# Patient Record
Sex: Female | Born: 1985 | Race: Black or African American | Hispanic: No | Marital: Single | Smoking: Never smoker
Health system: Southern US, Community
[De-identification: ages and names within clinical notes are randomized; demographics above are authoritative.]

## PROBLEM LIST (undated history)

## (undated) DIAGNOSIS — J45909 Unspecified asthma, uncomplicated: Secondary | ICD-10-CM

## (undated) DIAGNOSIS — D649 Anemia, unspecified: Secondary | ICD-10-CM

## (undated) HISTORY — DX: Anemia, unspecified: D64.9

## (undated) HISTORY — DX: Unspecified asthma, uncomplicated: J45.909

---
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
## 2003-07-02 ENCOUNTER — Other Ambulatory Visit: Admission: RE | Admit: 2003-07-02 | Discharge: 2003-07-02 | Payer: Self-pay | Admitting: Obstetrics and Gynecology

## 2004-08-02 ENCOUNTER — Emergency Department (HOSPITAL_COMMUNITY): Admission: EM | Admit: 2004-08-02 | Discharge: 2004-08-02 | Payer: Self-pay | Admitting: Emergency Medicine

## 2004-11-14 ENCOUNTER — Emergency Department (HOSPITAL_COMMUNITY): Admission: EM | Admit: 2004-11-14 | Discharge: 2004-11-14 | Payer: Self-pay | Admitting: Emergency Medicine

## 2004-11-28 ENCOUNTER — Other Ambulatory Visit: Admission: RE | Admit: 2004-11-28 | Discharge: 2004-11-28 | Payer: Self-pay | Admitting: Family Medicine

## 2005-06-01 ENCOUNTER — Encounter: Admission: RE | Admit: 2005-06-01 | Discharge: 2005-06-01 | Payer: Self-pay | Admitting: Family Medicine

## 2005-06-22 ENCOUNTER — Ambulatory Visit: Payer: Self-pay | Admitting: Internal Medicine

## 2005-07-16 ENCOUNTER — Ambulatory Visit: Payer: Self-pay | Admitting: Gastroenterology

## 2005-10-26 ENCOUNTER — Other Ambulatory Visit: Admission: RE | Admit: 2005-10-26 | Discharge: 2005-10-26 | Payer: Self-pay | Admitting: Obstetrics and Gynecology

## 2005-12-21 ENCOUNTER — Other Ambulatory Visit: Admission: RE | Admit: 2005-12-21 | Discharge: 2005-12-21 | Payer: Self-pay | Admitting: Obstetrics and Gynecology

## 2007-02-08 ENCOUNTER — Inpatient Hospital Stay (HOSPITAL_COMMUNITY): Admission: AD | Admit: 2007-02-08 | Discharge: 2007-02-09 | Payer: Self-pay | Admitting: Gynecology

## 2007-02-10 ENCOUNTER — Inpatient Hospital Stay (HOSPITAL_COMMUNITY): Admission: AD | Admit: 2007-02-10 | Discharge: 2007-02-10 | Payer: Self-pay | Admitting: Obstetrics & Gynecology

## 2007-02-13 ENCOUNTER — Inpatient Hospital Stay (HOSPITAL_COMMUNITY): Admission: AD | Admit: 2007-02-13 | Discharge: 2007-02-13 | Payer: Self-pay | Admitting: Obstetrics and Gynecology

## 2007-02-19 ENCOUNTER — Inpatient Hospital Stay (HOSPITAL_COMMUNITY): Admission: AD | Admit: 2007-02-19 | Discharge: 2007-02-19 | Payer: Self-pay | Admitting: Obstetrics and Gynecology

## 2007-02-26 ENCOUNTER — Inpatient Hospital Stay (HOSPITAL_COMMUNITY): Admission: AD | Admit: 2007-02-26 | Discharge: 2007-02-26 | Payer: Self-pay | Admitting: Gynecology

## 2007-03-05 ENCOUNTER — Inpatient Hospital Stay (HOSPITAL_COMMUNITY): Admission: AD | Admit: 2007-03-05 | Discharge: 2007-03-05 | Payer: Self-pay | Admitting: Obstetrics and Gynecology

## 2008-01-08 ENCOUNTER — Emergency Department (HOSPITAL_COMMUNITY): Admission: EM | Admit: 2008-01-08 | Discharge: 2008-01-08 | Payer: Self-pay | Admitting: Family Medicine

## 2008-06-30 ENCOUNTER — Emergency Department (HOSPITAL_COMMUNITY): Admission: EM | Admit: 2008-06-30 | Discharge: 2008-06-30 | Payer: Self-pay | Admitting: Emergency Medicine

## 2010-05-07 ENCOUNTER — Emergency Department (HOSPITAL_COMMUNITY): Admission: EM | Admit: 2010-05-07 | Discharge: 2010-05-07 | Payer: Self-pay | Admitting: Family Medicine

## 2010-08-16 ENCOUNTER — Inpatient Hospital Stay (HOSPITAL_COMMUNITY): Admission: AD | Admit: 2010-08-16 | Discharge: 2010-08-18 | Payer: Self-pay | Admitting: Obstetrics and Gynecology

## 2010-10-09 ENCOUNTER — Encounter: Admission: RE | Admit: 2010-10-09 | Discharge: 2010-10-09 | Payer: Self-pay | Admitting: Family Medicine

## 2011-01-07 ENCOUNTER — Inpatient Hospital Stay (INDEPENDENT_AMBULATORY_CARE_PROVIDER_SITE_OTHER)
Admission: RE | Admit: 2011-01-07 | Discharge: 2011-01-07 | Disposition: A | Discharge status: Home / Self Care | Payer: Medicaid Other | Source: Ambulatory Visit | Attending: Emergency Medicine | Admitting: Emergency Medicine

## 2011-01-07 DIAGNOSIS — J069 Acute upper respiratory infection, unspecified: Secondary | ICD-10-CM

## 2011-01-07 DIAGNOSIS — H9209 Otalgia, unspecified ear: Secondary | ICD-10-CM

## 2011-02-01 LAB — URINE MICROSCOPIC-ADD ON

## 2011-02-01 LAB — CBC
HCT: 21.3 % — ABNORMAL LOW (ref 36.0–46.0)
HCT: 30.8 % — ABNORMAL LOW (ref 36.0–46.0)
Hemoglobin: 10.2 g/dL — ABNORMAL LOW (ref 12.0–15.0)
Hemoglobin: 7.3 g/dL — ABNORMAL LOW (ref 12.0–15.0)
MCV: 91.5 fL (ref 78.0–100.0)
RBC: 2.33 MIL/uL — ABNORMAL LOW (ref 3.87–5.11)
RBC: 3.42 MIL/uL — ABNORMAL LOW (ref 3.87–5.11)
RDW: 15.1 % (ref 11.5–15.5)
WBC: 14.7 10*3/uL — ABNORMAL HIGH (ref 4.0–10.5)
WBC: 8.9 10*3/uL (ref 4.0–10.5)

## 2011-02-01 LAB — URINALYSIS, ROUTINE W REFLEX MICROSCOPIC
Bilirubin Urine: NEGATIVE
Nitrite: NEGATIVE
Specific Gravity, Urine: 1.025 (ref 1.005–1.030)
Urobilinogen, UA: 0.2 mg/dL (ref 0.0–1.0)
pH: 6 (ref 5.0–8.0)

## 2011-02-01 LAB — RAPID URINE DRUG SCREEN, HOSP PERFORMED
Amphetamines: NOT DETECTED
Tetrahydrocannabinol: NOT DETECTED

## 2011-02-01 LAB — RPR: RPR Ser Ql: NONREACTIVE

## 2011-08-17 LAB — HERPES SIMPLEX VIRUS CULTURE: Culture: DETECTED

## 2014-06-15 ENCOUNTER — Encounter (INDEPENDENT_AMBULATORY_CARE_PROVIDER_SITE_OTHER): Payer: BC Managed Care – PPO | Admitting: Surgery

## 2016-09-21 ENCOUNTER — Institutional Professional Consult (permissible substitution): Payer: Medicaid Other | Admitting: Internal Medicine

## 2017-08-13 DIAGNOSIS — L309 Dermatitis, unspecified: Secondary | ICD-10-CM | POA: Insufficient documentation

## 2018-10-24 ENCOUNTER — Ambulatory Visit: Payer: Self-pay | Admitting: Nurse Practitioner

## 2019-01-02 ENCOUNTER — Encounter: Payer: Self-pay | Admitting: Nurse Practitioner

## 2019-01-02 ENCOUNTER — Ambulatory Visit: Payer: BC Managed Care – PPO | Admitting: Nurse Practitioner

## 2019-01-02 VITALS — BP 120/78 | HR 82 | Temp 98.3°F | Ht 68.8 in | Wt 220.4 lb

## 2019-01-02 DIAGNOSIS — Z8269 Family history of other diseases of the musculoskeletal system and connective tissue: Secondary | ICD-10-CM | POA: Diagnosis not present

## 2019-01-02 DIAGNOSIS — R768 Other specified abnormal immunological findings in serum: Secondary | ICD-10-CM | POA: Diagnosis not present

## 2019-01-02 DIAGNOSIS — R21 Rash and other nonspecific skin eruption: Secondary | ICD-10-CM | POA: Diagnosis not present

## 2019-01-02 MED ORDER — TRIAMCINOLONE ACETONIDE 0.025 % EX OINT: 1.0000 "application " | TOPICAL_OINTMENT | Freq: Two times a day (BID) | CUTANEOUS | 0 refills | Status: DC

## 2019-01-02 NOTE — Progress Notes (Signed)
  Subjective:     Patient ID: Melanie Brooks , female    DOB: 1986/11/10 , 33 y.o.   MRN: [id number]   Chief Complaint  Patient presents with  . Rash    patient broke ou in a rash around her mouth and the top of her lip feels scaly and the skin feels hard.    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 Virgil Benedict for previous  Rash  This is a new problem. The current episode started in the past 7 days. The affected locations include the lips. The rash is characterized by scaling. Associated with: she has been using ambi - x 1 month ago.   she has also been using a new lip gloss.  Pertinent negatives include no anorexia, fatigue, fever or sore throat. Past treatments include nothing. The treatment provided no relief. Her past medical history is significant for eczema. There is no history of asthma.     No past medical history on file.   No family history on file.  No current outpatient medications on file.   Allergies  Allergen Reactions  . Excedrin Pm [Diphenhydramine-Apap (Sleep)] Hives     Review of Systems  Constitutional: Negative for fatigue and fever.  HENT: Negative for sore throat.   Gastrointestinal: Negative for anorexia.  Skin: Positive for rash.     Today's Vitals   01/02/19 1601  BP: 120/78  Pulse: 82  Temp: 98.3 F (36.8 C)  TempSrc: Oral  SpO2: 97%  Weight: 220 lb 6.4 oz (100 kg)  Height: 5' 8.8" (1.748 m)  PainSc: 0-No pain   Body mass index is 32.74 kg/m.   Objective:  Physical Exam      Assessment And Plan:     1. Rash and nonspecific skin eruption  Dry scaly slightly hyperpigmented rash around upper lip  HSV vs dermatitis   She also has a hyperpigmentation around her nose - ANA+ENA+DNA/DS+Scl 70+SjoSSA/B - HSV Type I/II IgG, IgMw/ reflex  2. ANA positive  This has been elevated in the past over 3 years ago  Will check again today since she did not follow up previously  Her mother has a history of  lupus - ANA+ENA+DNA/DS+Scl 70+SjoSSA/B - HSV Type I/II IgG, IgMw/ reflex   [name], FNP

## 2019-01-06 LAB — HSV TYPE I/II IGG, IGMW/ REFLEX
HSV 1 Glycoprotein G Ab, IgG: 19.8 index — ABNORMAL HIGH (ref 0.00–0.90)
HSV 1 IgM: <1:10 {titer}
HSV 2 IgG, Type Spec: <0.91 index (ref 0.00–0.90)
HSV 2 IgM: <1:10 {titer}

## 2019-01-06 LAB — ANA+ENA+DNA/DS+SCL 70+SJOSSA/B
ANA TITER 1: POSITIVE — AB
ENA RNP Ab: 0.8 AI (ref 0.0–0.9)
ENA SSB (LA) Ab: 0.9 AI (ref 0.0–0.9)
dsDNA Ab: <1 IU/mL (ref 0–9)

## 2019-01-06 LAB — FANA STAINING PATTERNS: Speckled Pattern: 1:80 {titer}

## 2019-01-09 ENCOUNTER — Other Ambulatory Visit: Payer: Self-pay | Admitting: Nurse Practitioner

## 2019-01-09 DIAGNOSIS — B009 Herpesviral infection, unspecified: Secondary | ICD-10-CM

## 2019-01-09 DIAGNOSIS — R768 Other specified abnormal immunological findings in serum: Secondary | ICD-10-CM

## 2019-01-09 DIAGNOSIS — Z8269 Family history of other diseases of the musculoskeletal system and connective tissue: Secondary | ICD-10-CM

## 2019-01-09 MED ORDER — VALACYCLOVIR HCL 500 MG PO TABS: 500.0000 mg | ORAL_TABLET | Freq: Two times a day (BID) | ORAL | 0 refills | Status: DC

## 2019-01-09 NOTE — Progress Notes (Signed)
Okay, I will send a medication for her to take twice a day for 14 days, she did not mention a rash to her genital area when she was here in the office.  I am also referring her to a rheumatologist for positive ANA and elevated autoimmune panel.

## 2019-01-13 ENCOUNTER — Encounter: Payer: Self-pay | Admitting: Nurse Practitioner

## 2019-06-10 ENCOUNTER — Telehealth: Payer: Self-pay

## 2019-06-10 NOTE — Telephone Encounter (Signed)
Patient called stating her period started on 07/06 and lasted her normal 4-5 days but she is still bleeding I returned pt call and advised her to f/u with her OBGYN. Melanie Brooks

## 2019-06-15 ENCOUNTER — Other Ambulatory Visit (HOSPITAL_COMMUNITY)
Admission: RE | Admit: 2019-06-15 | Discharge: 2019-06-15 | Disposition: A | Discharge status: Home / Self Care | Payer: BC Managed Care – PPO | Source: Ambulatory Visit | Attending: Nurse Practitioner | Admitting: Nurse Practitioner

## 2019-06-15 ENCOUNTER — Other Ambulatory Visit: Payer: Self-pay

## 2019-06-15 ENCOUNTER — Ambulatory Visit: Payer: BC Managed Care – PPO | Admitting: Nurse Practitioner

## 2019-06-15 ENCOUNTER — Encounter: Payer: Self-pay | Admitting: Nurse Practitioner

## 2019-06-15 VITALS — BP 110/72 | HR 92 | Temp 98.6°F | Ht 68.8 in | Wt 224.2 lb

## 2019-06-15 DIAGNOSIS — Z708 Other sex counseling: Secondary | ICD-10-CM

## 2019-06-15 DIAGNOSIS — Z0000 Encounter for general adult medical examination without abnormal findings: Secondary | ICD-10-CM | POA: Diagnosis not present

## 2019-06-15 DIAGNOSIS — Z124 Encounter for screening for malignant neoplasm of cervix: Secondary | ICD-10-CM

## 2019-06-15 LAB — POCT URINALYSIS DIPSTICK
Bilirubin, UA: NEGATIVE
Glucose, UA: NEGATIVE
Ketones, UA: NEGATIVE
Leukocytes, UA: NEGATIVE
Nitrite, UA: NEGATIVE
Protein, UA: NEGATIVE
Spec Grav, UA: 1.025 (ref 1.010–1.025)
Urobilinogen, UA: 0.2 E.U./dL
pH, UA: 6 (ref 5.0–8.0)

## 2019-06-15 NOTE — Progress Notes (Signed)
Subjective:     Patient ID: Melanie Brooks , female    DOB: [birth year] , 33 y.o.   MRN: [id number]   Chief Complaint  Patient presents with  . Annual Exam    HPI  Here for HM  LMP - July 1 on for the entire month.  Today is the first day she has had spotting. Her Nexplanon is due to come out as of March 2019.    The patient states she uses Nexplanon for birth control. Last LMP was No LMP recorded. Patient has had an implant.. Negative for Dysmenorrhea and Positive for Menorrhagia.  Negative for: breast discharge, breast lump(s), breast pain and breast self exam.  Pertinent negatives include abnormal bleeding (hematology), anxiety, decreased libido, depression, difficulty falling sleep, dyspareunia, history of infertility, nocturia, sexual dysfunction, sleep disturbances, urinary incontinence, urinary urgency, vaginal discharge and vaginal itching. Diet regular.The patient states her exercise level is      The patient's tobacco use is:  Social History   Tobacco Use  Smoking Status Never Smoker  Smokeless Tobacco Never Used   She has been exposed to passive smoke. The patient's alcohol use is:  Social History   Substance and Sexual Activity  Alcohol Use Yes   Comment: socially     No past medical history on file.   No family history on file.   Current Outpatient Medications:  .  fexofenadine (ALLEGRA) 60 MG tablet, Take 60 mg by mouth 2 (two) times daily., Disp: , Rfl:  .  triamcinolone (KENALOG) 0.025 % ointment, Apply 1 application topically 2 (two) times daily. (Patient not taking: Reported on 06/15/2019), Disp: 30 g, Rfl: 0 .  valACYclovir (VALTREX) 500 MG tablet, Take 1 tablet (500 mg total) by mouth 2 (two) times daily. (Patient not taking: Reported on 06/15/2019), Disp: 14 tablet, Rfl: 0   Allergies  Allergen Reactions  . Excedrin Pm [Diphenhydramine-Apap (Sleep)] Hives     Review of Systems  Constitutional: Negative.   HENT: Negative.   Eyes: Negative.    Respiratory: Negative.   Cardiovascular: Negative.  Negative for chest pain, palpitations and leg swelling.  Gastrointestinal: Negative.   Endocrine: Negative.   Genitourinary: Negative.   Musculoskeletal: Negative.   Skin: Negative.   Allergic/Immunologic: Negative.   Neurological: Negative.  Negative for dizziness, facial asymmetry and headaches.  Hematological: Negative.   Psychiatric/Behavioral: Negative.      Today's Vitals   06/15/19 1529  BP: 110/72  Pulse: 92  Temp: 98.6 F (37 C)  TempSrc: Oral  Weight: 224 lb 3.2 oz (101.7 kg)  Height: 5' 8.8" (1.748 m)  PainSc: 0-No pain   Body mass index is 33.3 kg/m.   Objective:  Physical Exam Constitutional:      Appearance: Normal appearance. She is well-developed.  HENT:     Head: Normocephalic and atraumatic.     Right Ear: Hearing, tympanic membrane, ear canal and external ear normal. There is no impacted cerumen.     Left Ear: Hearing, tympanic membrane, ear canal and external ear normal. There is no impacted cerumen.  Eyes:     General: Lids are normal.        Right eye: No discharge.     Conjunctiva/sclera: Conjunctivae normal.     Pupils: Pupils are equal, round, and reactive to light.     Funduscopic exam:    Right eye: No papilledema.        Left eye: No papilledema.  Neck:     Musculoskeletal: Full passive  range of motion without pain, normal range of motion and neck supple.     Thyroid: No thyroid mass.     Vascular: No carotid bruit.  Cardiovascular:     Rate and Rhythm: Normal rate and regular rhythm.     Pulses: Normal pulses.     Heart sounds: Normal heart sounds. No murmur.  Pulmonary:     Effort: Pulmonary effort is normal. No respiratory distress.     Breath sounds: Normal breath sounds.  Abdominal:     General: Abdomen is flat. Bowel sounds are normal. There is no distension.     Palpations: Abdomen is soft.  Musculoskeletal: Normal range of motion.        General: No swelling.     Right  lower leg: No edema.     Left lower leg: No edema.  Skin:    General: Skin is warm and dry.     Capillary Refill: Capillary refill takes less than 2 seconds.  Neurological:     General: No focal deficit present.     Mental Status: She is alert and oriented to person, place, and time.     Cranial Nerves: No cranial nerve deficit.     Sensory: No sensory deficit.  Psychiatric:        Mood and Affect: Mood normal.        Behavior: Behavior normal.        Thought Content: Thought content normal.        Judgment: Judgment normal.         Assessment And Plan:     1. Encounter for general adult medical examination w/o abnormal findings . Behavior modifications discussed and diet history reviewed.   . Pt will continue to exercise regularly and modify diet with low GI, plant based foods and decrease intake of processed foods.  . Recommend intake of daily multivitamin, Vitamin D, and calcium.  . Recommend for preventive screenings, as well as recommend immunizations that include influenza, TDAP - POCT Urinalysis Dipstick (81002) - BMP8+Anion Gap - Lipid Profile - CBC no Diff - Hemoglobin A1c - Vitamin D (25 hydroxy)  2. Encounter for Papanicolaou smear of cervix  No abnormal findings on physical exam - Cytology -Pap Smear  3. Encounter for sexually transmitted disease counseling  - Cervicovaginal ancillary only - T pallidum Screening Cascade - HIV antibody (with reflex)     Minette Brine, FNP    THE PATIENT IS ENCOURAGED TO PRACTICE SOCIAL DISTANCING DUE TO THE COVID-19 PANDEMIC.

## 2019-06-17 LAB — BMP8+ANION GAP
Anion Gap: 16 mmol/L (ref 10.0–18.0)
BUN/Creatinine Ratio: 9 (ref 9–23)
BUN: 9 mg/dL (ref 6–20)
CO2: 23 mmol/L (ref 20–29)
Calcium: 9.4 mg/dL (ref 8.7–10.2)
Chloride: 103 mmol/L (ref 96–106)
Creatinine, Ser: 0.98 mg/dL (ref 0.57–1.00)
GFR calc Af Amer: 86 mL/min/{1.73_m2} (ref >59)
GFR calc non Af Amer: 75 mL/min/{1.73_m2} (ref >59)
Glucose: 77 mg/dL (ref 65–99)
Potassium: 3.4 mmol/L — ABNORMAL LOW (ref 3.5–5.2)
Sodium: 142 mmol/L (ref 134–144)

## 2019-06-17 LAB — LIPID PANEL
Chol/HDL Ratio: 3.2 ratio (ref 0.0–4.4)
Cholesterol, Total: 138 mg/dL (ref 100–199)
HDL: 43 mg/dL (ref >39)
LDL Calculated: 76 mg/dL (ref 0–99)
Triglycerides: 96 mg/dL (ref 0–149)
VLDL Cholesterol Cal: 19 mg/dL (ref 5–40)

## 2019-06-17 LAB — CBC
Hematocrit: 36.6 % (ref 34.0–46.6)
Hemoglobin: 11.7 g/dL (ref 11.1–15.9)
MCH: 29.7 pg (ref 26.6–33.0)
MCHC: 32 g/dL (ref 31.5–35.7)
MCV: 93 fL (ref 79–97)
Platelets: 286 10*3/uL (ref 150–450)
RBC: 3.94 x10E6/uL (ref 3.77–5.28)
RDW: 12.2 % (ref 11.7–15.4)
WBC: 6 10*3/uL (ref 3.4–10.8)

## 2019-06-17 LAB — T PALLIDUM SCREENING CASCADE: T pallidum Antibodies (TP-PA): NONREACTIVE

## 2019-06-17 LAB — HEMOGLOBIN A1C
Est. average glucose Bld gHb Est-mCnc: 126 mg/dL
Hgb A1c MFr Bld: 6 % — ABNORMAL HIGH (ref 4.8–5.6)

## 2019-06-17 LAB — HIV ANTIBODY (ROUTINE TESTING W REFLEX): HIV Screen 4th Generation wRfx: NONREACTIVE

## 2019-06-17 LAB — VITAMIN D 25 HYDROXY (VIT D DEFICIENCY, FRACTURES): Vit D, 25-Hydroxy: 27.5 ng/mL — ABNORMAL LOW (ref 30.0–100.0)

## 2019-06-18 LAB — CERVICOVAGINAL ANCILLARY ONLY
Bacterial vaginitis: POSITIVE — AB
Candida vaginitis: NEGATIVE
Chlamydia: NEGATIVE
Neisseria Gonorrhea: NEGATIVE
Trichomonas: NEGATIVE

## 2019-06-19 LAB — CYTOLOGY - PAP
Adequacy: ABSENT
Diagnosis: NEGATIVE

## 2019-06-26 ENCOUNTER — Other Ambulatory Visit: Payer: Self-pay | Admitting: Nurse Practitioner

## 2019-06-26 DIAGNOSIS — B9689 Other specified bacterial agents as the cause of diseases classified elsewhere: Secondary | ICD-10-CM

## 2019-06-26 DIAGNOSIS — N760 Acute vaginitis: Secondary | ICD-10-CM

## 2019-06-26 MED ORDER — METRONIDAZOLE 500 MG PO TABS: 500.0000 mg | ORAL_TABLET | Freq: Two times a day (BID) | ORAL | 0 refills | Status: DC

## 2019-07-06 ENCOUNTER — Other Ambulatory Visit: Payer: Self-pay | Admitting: Nurse Practitioner

## 2019-07-06 ENCOUNTER — Other Ambulatory Visit: Payer: Self-pay

## 2019-07-06 MED ORDER — METRONIDAZOLE 0.75 % EX GEL: 1.0000 "application " | Freq: Two times a day (BID) | CUTANEOUS | 0 refills | Status: DC

## 2019-07-06 MED ORDER — METRONIDAZOLE 1 % EX GEL: CUTANEOUS | 0 refills | Status: DC

## 2019-07-09 ENCOUNTER — Telehealth: Payer: Self-pay

## 2019-07-09 ENCOUNTER — Other Ambulatory Visit: Payer: Self-pay

## 2019-07-09 MED ORDER — FLUCONAZOLE 150 MG PO TABS: ORAL_TABLET | ORAL | 0 refills | Status: DC

## 2019-07-09 NOTE — Telephone Encounter (Signed)
Patient called asking if the metronidazole we had sent to the pharmacy was to treat the BV or yeast infection. She stated she is having some discharge.  RETURNED PT CALL AND ADVISED HER THAT IT WAS TO TREAT HER BV AND WE HAVE SENT IN THE DIFLUCAN FOR THE YEAST INFECTION. YRL,RMA

## 2019-08-07 ENCOUNTER — Other Ambulatory Visit: Payer: Self-pay | Admitting: Nurse Practitioner

## 2019-09-21 ENCOUNTER — Other Ambulatory Visit: Payer: Self-pay

## 2019-09-21 ENCOUNTER — Encounter: Payer: Self-pay | Admitting: Nurse Practitioner

## 2019-09-21 ENCOUNTER — Ambulatory Visit: Payer: BC Managed Care – PPO | Admitting: Nurse Practitioner

## 2019-09-21 VITALS — BP 116/68 | HR 77 | Temp 98.4°F | Ht 67.8 in | Wt 223.2 lb

## 2019-09-21 DIAGNOSIS — J302 Other seasonal allergic rhinitis: Secondary | ICD-10-CM

## 2019-09-21 DIAGNOSIS — R519 Headache, unspecified: Secondary | ICD-10-CM

## 2019-09-21 DIAGNOSIS — R21 Rash and other nonspecific skin eruption: Secondary | ICD-10-CM | POA: Diagnosis not present

## 2019-09-21 DIAGNOSIS — G8929 Other chronic pain: Secondary | ICD-10-CM

## 2019-09-21 MED ORDER — FEXOFENADINE HCL 60 MG PO TABS: 60.0000 mg | ORAL_TABLET | Freq: Every day | ORAL | 1 refills | Status: AC

## 2019-09-21 MED ORDER — UBRELVY 50 MG PO TABS: 1.0000 | ORAL_TABLET | Freq: Every day | ORAL | 2 refills | Status: DC

## 2019-09-21 NOTE — Progress Notes (Signed)
Subjective:     Patient ID: Melanie Brooks , female    DOB: [birth year] , 33 y.o.   MRN: [id number]   Chief Complaint  Patient presents with  . Migraine    Patient stated she has been trying to prevent her migraines with diet, limited screen time.     HPI  Flourescent lights will intensify her headaches.  When she eats pork she will have migraines.  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  She began having more migraines since mid September, for the last month she has been at the school 8 hours.  She is taking 800 mg twice a day.  LMP - October 18-22nd - she is on NuvaRing no longer has Implanon.  She is exercising by walking and drinks water during the day regularly.   Migraine  This is a chronic problem. The current episode started more than 1 month ago. The problem occurs intermittently. The problem has been gradually worsening. The pain is located in the bilateral (middle going towards the back) region. The pain does not radiate. The pain quality is similar to prior headaches. The quality of the pain is described as aching and pulsating. The pain is at a severity of 6/10. The pain is moderate. Associated symptoms include photophobia. Pertinent negatives include no abdominal pain, blurred vision, dizziness, nausea or tinnitus. The symptoms are aggravated by bright light and work (Works as a Education officer, museum ). She has tried NSAIDs for the symptoms. The treatment provided no relief. Her past medical history is significant for migraine headaches. There is no history of cancer.     No past medical history on file.   No family history on file.   Current Outpatient Medications:  .  fexofenadine (ALLEGRA) 60 MG tablet, Take 60 mg by mouth 2 (two) times daily., Disp: , Rfl:  .  ibuprofen (ADVIL) 800 MG tablet, Take 800 mg by mouth every 8 (eight) hours as needed., Disp: , Rfl:  .  triamcinolone (KENALOG) 0.025 % ointment, Apply 1 application topically 2 (two) times  daily. (Patient not taking: Reported on 06/15/2019), Disp: 30 g, Rfl: 0 .  valACYclovir (VALTREX) 500 MG tablet, Take 1 tablet (500 mg total) by mouth 2 (two) times daily. (Patient not taking: Reported on 06/15/2019), Disp: 14 tablet, Rfl: 0   Allergies  Allergen Reactions  . Excedrin Pm [Diphenhydramine-Apap (Sleep)] Hives     Review of Systems  Constitutional: Negative.   HENT: Negative for tinnitus.   Eyes: Positive for photophobia. Negative for blurred vision.  Respiratory: Negative.   Cardiovascular: Negative.  Negative for chest pain, palpitations and leg swelling.  Gastrointestinal: Negative for abdominal pain and nausea.  Neurological: Positive for headaches. Negative for dizziness.  Psychiatric/Behavioral: Negative.      Today's Vitals   09/21/19 1557  BP: 116/68  Pulse: 77  Temp: 98.4 F (36.9 C)  TempSrc: Oral  Weight: 223 lb 3.2 oz (101.2 kg)  Height: 5' 7.8" (1.722 m)  PainSc: 6   PainLoc: Head   Body mass index is 34.14 kg/m.   Objective:  Physical Exam Vitals signs reviewed.  Cardiovascular:     Rate and Rhythm: Normal rate and regular rhythm.     Pulses: Normal pulses.     Heart sounds: Normal heart sounds. No murmur.  Pulmonary:     Effort: Pulmonary effort is normal. No respiratory distress.     Breath sounds: Normal breath sounds.  Skin:    Capillary  Refill: Capillary refill takes less than 2 seconds.  Neurological:     General: No focal deficit present.     Mental Status: She is alert and oriented to person, place, and time.  Psychiatric:        Mood and Affect: Mood normal.        Behavior: Behavior normal.        Thought Content: Thought content normal.        Judgment: Judgment normal.         Assessment And Plan:      1. Chronic nonintractable headache, unspecified headache type  The reoccurrence of her headaches are likely related to stress and the flourescent lights are worsening the symptoms  Will try her with ubrelvy as  needed  2. Seasonal allergies  Her seasonal allergies could also be causing her headaches  Will refill prescription for fexofenadine - fexofenadine (ALLEGRA) 60 MG tablet; Take 1 tablet (60 mg total) by mouth daily.  Dispense: 90 tablet; Refill: 1  3. Rash and nonspecific skin eruption  Flesh colored slight prickly rash to left arm  Encouraged to use the triamcinolone cream may be a dermatitis   [name], FNP    THE PATIENT IS ENCOURAGED TO PRACTICE SOCIAL DISTANCING DUE TO THE COVID-19 PANDEMIC.

## 2019-09-27 ENCOUNTER — Encounter: Payer: Self-pay | Admitting: Nurse Practitioner

## 2019-09-29 ENCOUNTER — Other Ambulatory Visit: Payer: Self-pay

## 2019-09-29 ENCOUNTER — Telehealth: Payer: Self-pay

## 2019-09-29 NOTE — Telephone Encounter (Signed)
I called patient because her Melanie Brooks was denied and she wanted to see how she is doing and if she is ok with trying a different med. YRL,RMA

## 2019-12-01 ENCOUNTER — Other Ambulatory Visit: Payer: Self-pay

## 2019-12-01 MED ORDER — SUMATRIPTAN SUCCINATE 50 MG PO TABS: ORAL_TABLET | ORAL | 0 refills | Status: DC

## 2020-01-16 ENCOUNTER — Ambulatory Visit: Payer: BC Managed Care – PPO

## 2020-02-15 ENCOUNTER — Encounter: Payer: Self-pay | Admitting: Nurse Practitioner

## 2020-02-15 ENCOUNTER — Ambulatory Visit: Payer: BC Managed Care – PPO | Admitting: Nurse Practitioner

## 2020-02-15 ENCOUNTER — Other Ambulatory Visit: Payer: Self-pay

## 2020-02-15 VITALS — BP 118/78 | HR 102 | Temp 98.5°F | Ht 67.8 in | Wt 222.0 lb

## 2020-02-15 DIAGNOSIS — R519 Headache, unspecified: Secondary | ICD-10-CM | POA: Diagnosis not present

## 2020-02-15 DIAGNOSIS — G8929 Other chronic pain: Secondary | ICD-10-CM

## 2020-02-15 DIAGNOSIS — J302 Other seasonal allergic rhinitis: Secondary | ICD-10-CM | POA: Diagnosis not present

## 2020-02-15 MED ORDER — KETOROLAC TROMETHAMINE 60 MG/2ML IM SOLN
60.0000 mg | Freq: Once | INTRAMUSCULAR | Status: AC
  Administered 2020-02-15: 60 mg via INTRAMUSCULAR

## 2020-02-15 MED ORDER — LEVOCETIRIZINE DIHYDROCHLORIDE 5 MG PO TABS: 5.0000 mg | ORAL_TABLET | Freq: Every evening | ORAL | 1 refills | Status: DC

## 2020-02-15 MED ORDER — UBRELVY 50 MG PO TABS: 1.0000 | ORAL_TABLET | Freq: Every day | ORAL | 2 refills | Status: DC | PRN

## 2020-02-15 NOTE — Progress Notes (Signed)
This visit occurred during the SARS-CoV-2 public health emergency.  Safety protocols were in place, including screening questions prior to the visit, additional usage of staff PPE, and extensive cleaning of exam room while observing appropriate contact time as indicated for disinfecting solutions.  Subjective:     Patient ID: Melanie Brooks , female    DOB: 1986/05/17 , 34 y.o.   MRN: [id number]   Chief Complaint  Patient presents with  . Migraine    b/p check    HPI  She had her covid vaccine on Saturday - sore arm and migraine since then.  She also reports her blood pressure also went up during that time. February 7th LMP she has been keeping her nuvaring.  This week she has  Migraine  This is a recurrent problem. The current episode started more than 1 year ago. The problem occurs intermittently. The pain does not radiate. The pain quality is not similar to prior headaches. The pain is moderate. Pertinent negatives include no back pain, dizziness, sinus pressure or weakness. She has tried triptans for the symptoms. Her past medical history is significant for migraine headaches. There is no history of cancer or hypertension.     No past medical history on file.   No family history on file.   Current Outpatient Medications:  .  acetaminophen (TYLENOL) 500 MG tablet, Take 500 mg by mouth every 6 (six) hours as needed., Disp: , Rfl:  .  fexofenadine (ALLEGRA) 60 MG tablet, Take 1 tablet (60 mg total) by mouth daily., Disp: 90 tablet, Rfl: 1 .  SUMAtriptan (IMITREX) 50 MG tablet, Take at onset of headache May repeat in 2 hours if headache persists or recurs Do not exceed more than 2 tablets in 24 hours, Disp: 8 tablet, Rfl: 0   Allergies  Allergen Reactions  . Excedrin Pm [Diphenhydramine-Apap (Sleep)] Hives     Review of Systems  Constitutional: Negative.   HENT: Negative for sinus pressure.   Respiratory: Negative.   Cardiovascular: Negative.   Musculoskeletal: Negative  for back pain.  Neurological: Positive for headaches. Negative for dizziness and weakness.  Psychiatric/Behavioral: Negative.      Today's Vitals   02/15/20 1524  BP: 118/78  Pulse: (!) 102  Temp: 98.5 F (36.9 C)  TempSrc: Oral  SpO2: 96%  Weight: 222 lb (100.7 kg)  Height: 5' 7.8" (1.722 m)   Body mass index is 33.95 kg/m.   Objective:  Physical Exam Constitutional:      Appearance: Normal appearance.  Cardiovascular:     Rate and Rhythm: Normal rate and regular rhythm.     Pulses: Normal pulses.     Heart sounds: Normal heart sounds. No murmur.  Pulmonary:     Effort: Pulmonary effort is normal. No respiratory distress.     Breath sounds: Normal breath sounds.  Skin:    Capillary Refill: Capillary refill takes less than 2 seconds.  Neurological:     General: No focal deficit present.     Mental Status: She is alert and oriented to person, place, and time.  Psychiatric:        Mood and Affect: Mood normal.        Behavior: Behavior normal.        Thought Content: Thought content normal.        Judgment: Judgment normal.         Assessment And Plan:     1. Chronic nonintractable headache, unspecified headache type  This is more persistent  in recent weeks  She had better benefit from [place] will try this  Also given toradol injection 60 mg in office IM - Ubrogepant (UBRELVY) 50 MG TABS; Take 1 tablet by mouth daily as needed.  Dispense: 30 tablet; Refill: 2 - levocetirizine (XYZAL) 5 MG tablet; Take 1 tablet (5 mg total) by mouth every evening.  Dispense: 90 tablet; Refill: 1 - ketorolac (TORADOL) injection 60 mg  2. Seasonal allergies  Other allergy medication is not effective, she is to try xyzal at bedtime as it can make you sleepy   [name], FNP    THE PATIENT IS ENCOURAGED TO PRACTICE SOCIAL DISTANCING DUE TO THE COVID-19 PANDEMIC.

## 2020-02-16 ENCOUNTER — Telehealth: Payer: Self-pay

## 2020-02-16 NOTE — Telephone Encounter (Signed)
PA for Bernita Raisin has been submitted through covermymeds and we are waiting on the determination. YL,RMA

## 2020-06-16 ENCOUNTER — Encounter: Payer: BC Managed Care – PPO | Admitting: Nurse Practitioner

## 2020-06-21 LAB — HM PAP SMEAR

## 2020-06-23 ENCOUNTER — Encounter: Payer: BC Managed Care – PPO | Admitting: Nurse Practitioner

## 2020-06-24 ENCOUNTER — Encounter: Payer: Self-pay | Admitting: Nurse Practitioner

## 2020-07-07 ENCOUNTER — Other Ambulatory Visit: Payer: Self-pay | Admitting: Obstetrics and Gynecology

## 2020-07-07 DIAGNOSIS — R928 Other abnormal and inconclusive findings on diagnostic imaging of breast: Secondary | ICD-10-CM

## 2020-07-20 ENCOUNTER — Other Ambulatory Visit: Payer: Self-pay | Admitting: Obstetrics and Gynecology

## 2020-07-20 ENCOUNTER — Other Ambulatory Visit: Payer: Self-pay

## 2020-07-20 ENCOUNTER — Ambulatory Visit: Payer: BC Managed Care – PPO

## 2020-07-20 ENCOUNTER — Ambulatory Visit
Admission: RE | Admit: 2020-07-20 | Discharge: 2020-07-20 | Disposition: A | Discharge status: Home / Self Care | Payer: BC Managed Care – PPO | Source: Ambulatory Visit | Attending: Obstetrics and Gynecology | Admitting: Obstetrics and Gynecology

## 2020-07-20 DIAGNOSIS — R928 Other abnormal and inconclusive findings on diagnostic imaging of breast: Secondary | ICD-10-CM

## 2021-04-26 IMAGING — MG DIGITAL DIAGNOSTIC UNILAT RIGHT W/ CAD
3 series · 3 of 3 positions shown · non-contrast
Comparison: Baseline screening mammogram dated 06/22/2020.

CLINICAL DATA: Calcifications in the outer right breast in the
craniocaudal projection of a recent baseline screening mammogram at
[REDACTED[facility]. History of breast cancer in a maternal
cousin.

EXAM:
DIGITAL DIAGNOSTIC RIGHT MAMMOGRAM WITH CAD

[R ML (1 of 2)]
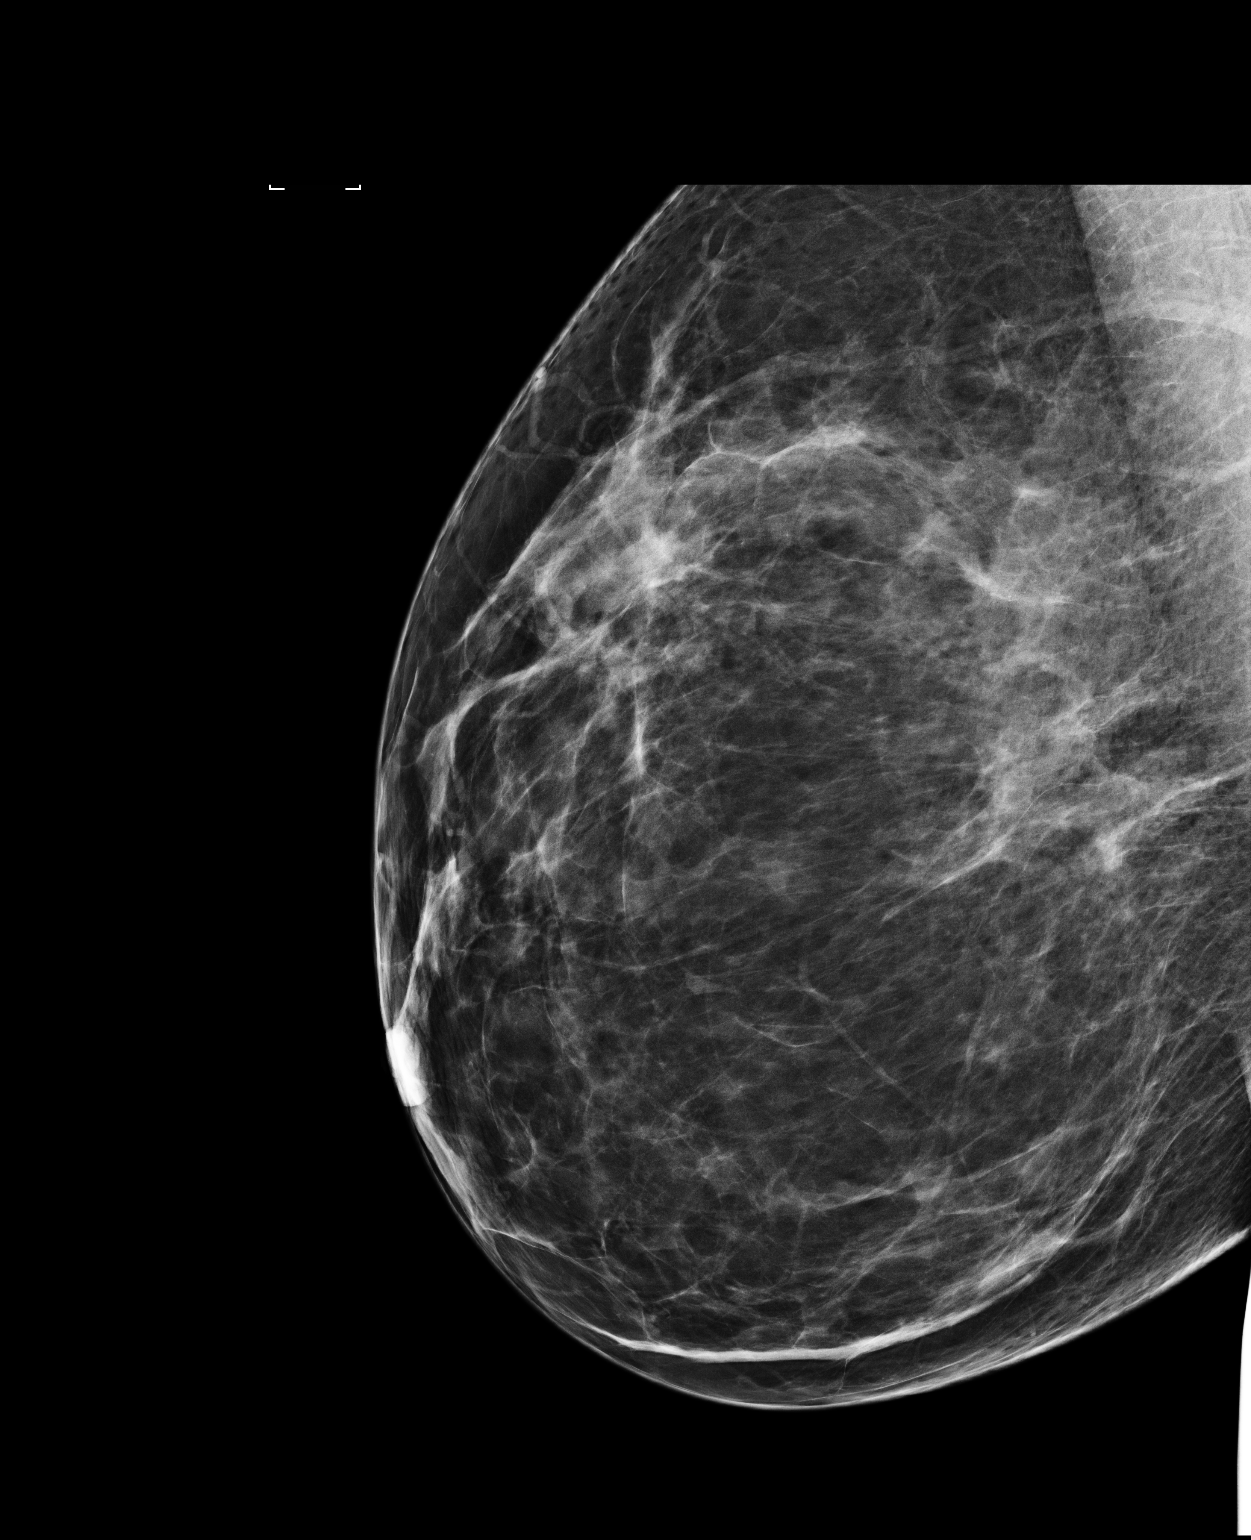

[R ML (2 of 2)]
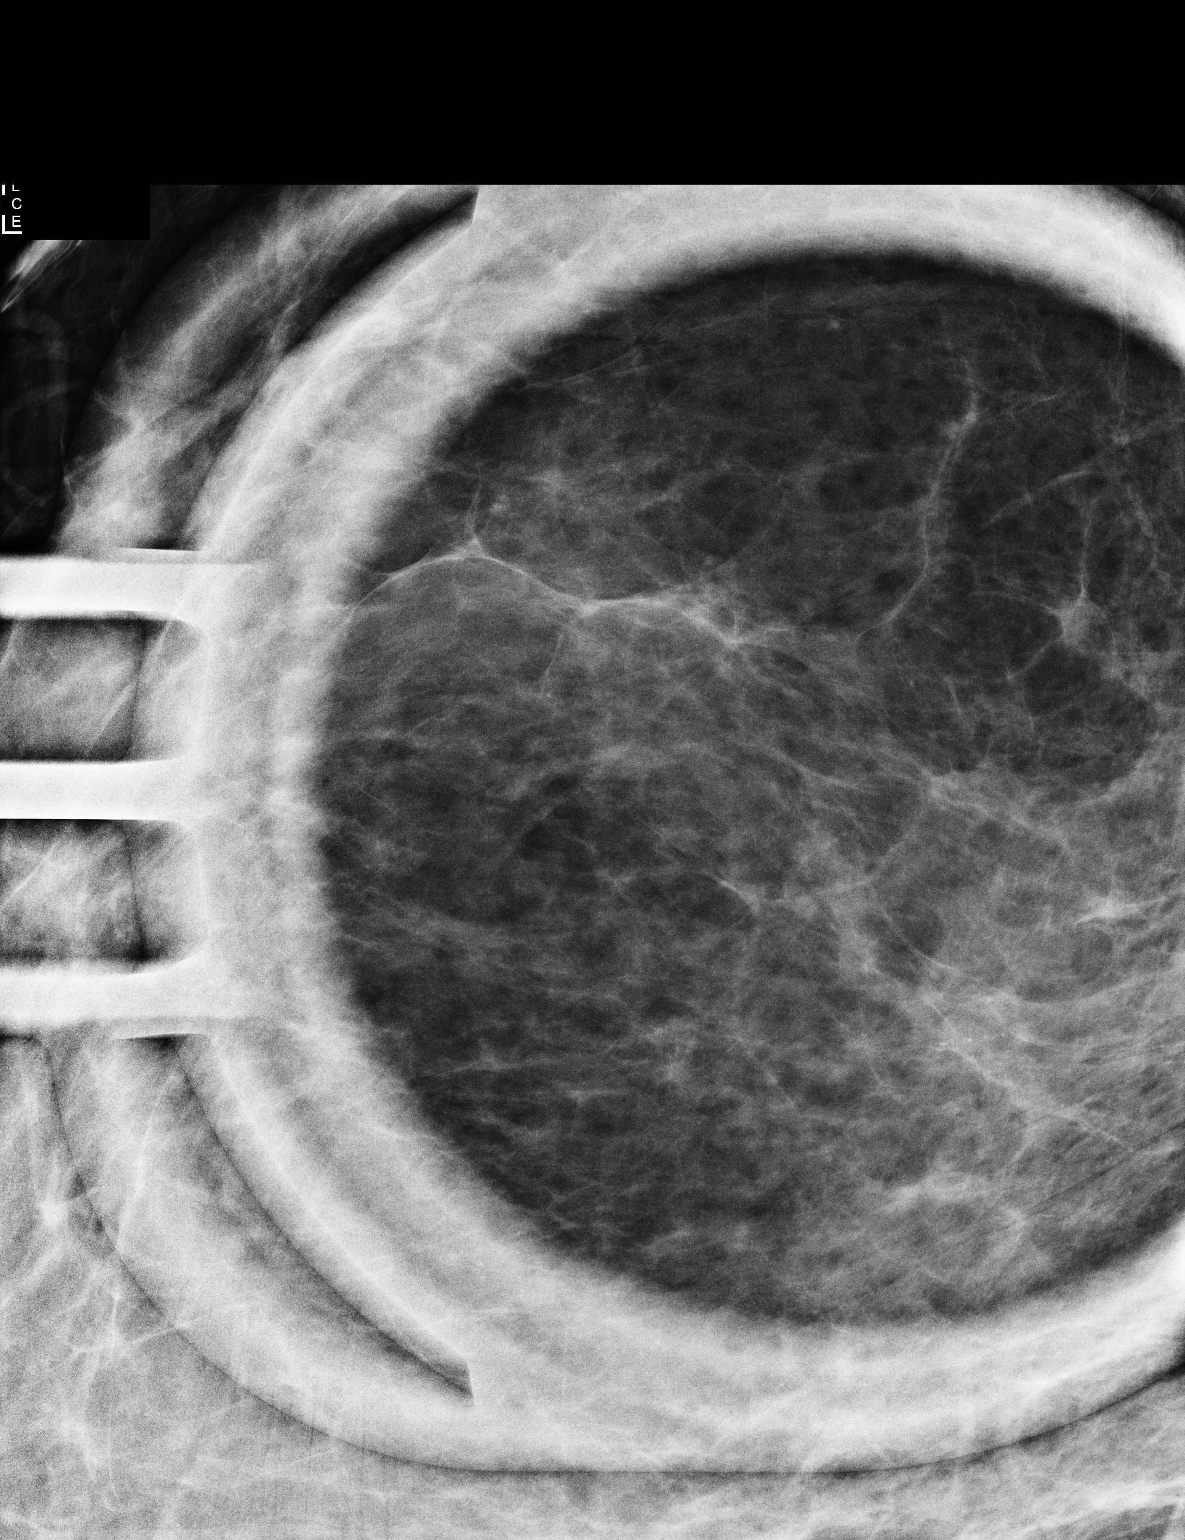

[R CC]
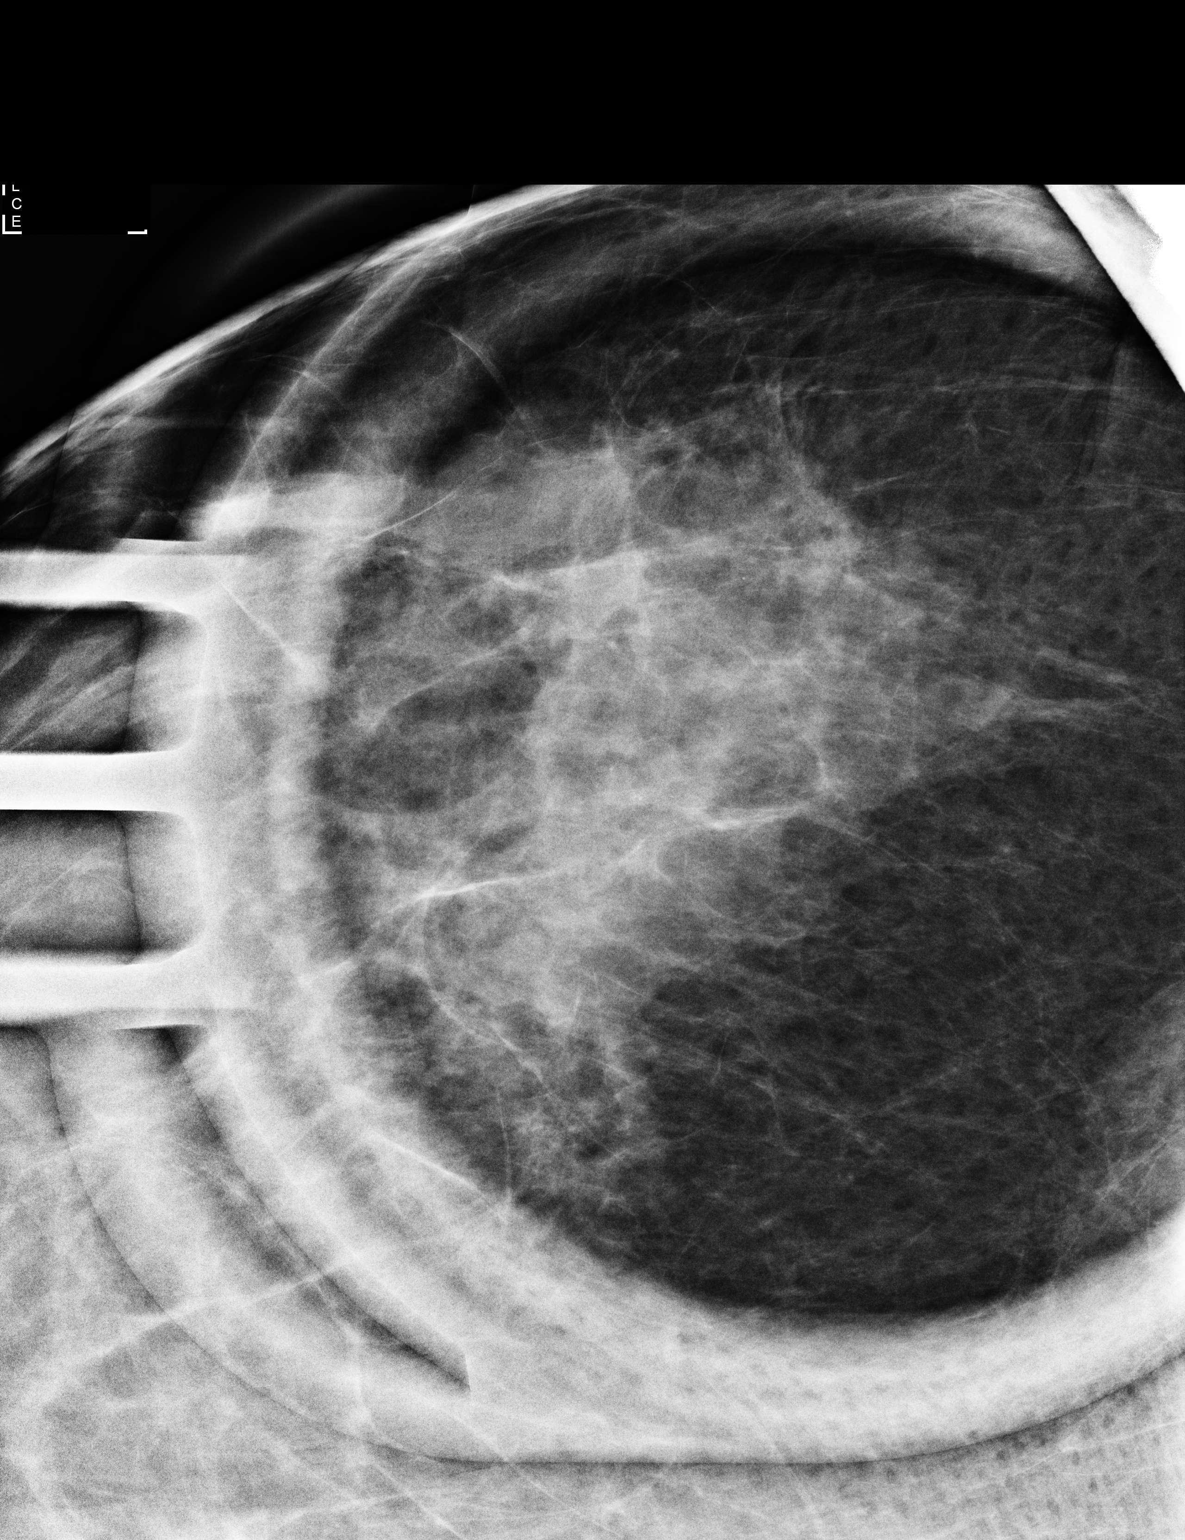

[3 of 3 positions shown; findings below may reference images not displayed]

ACR Breast Density Category c: The breast tissue is heterogeneously
dense, which may obscure small masses.
FINDINGS: 2D true lateral and spot magnification views of the right breast
demonstrate a small group of faint calcifications in the posterior
upper outer quadrant of the breast spanning 8 mm in maximum
dimension. These are round Fe an indistinct in the craniocaudal
projection and demonstrate dependent layering in the true lateral
projection.

Mammographic images were processed with CAD.
IMPRESSION: 1. 8 mm group of benign milk calcium in the upper outer right
breast.
2. No evidence of malignancy.

RECOMMENDATION:
Annual screening mammography beginning at age 40.

I have discussed the findings and recommendations with the patient.
If applicable, a reminder letter will be sent to the patient
regarding the next appointment.

BI-RADS CATEGORY  2: Benign.

## 2021-09-15 LAB — HM PAP SMEAR

## 2021-09-18 ENCOUNTER — Encounter: Payer: Self-pay | Admitting: Nurse Practitioner

## 2021-10-07 ENCOUNTER — Other Ambulatory Visit: Payer: Self-pay | Admitting: Nurse Practitioner

## 2021-11-20 ENCOUNTER — Encounter: Payer: Self-pay | Admitting: Nurse Practitioner

## 2021-11-20 ENCOUNTER — Other Ambulatory Visit: Payer: Self-pay | Admitting: Nurse Practitioner

## 2021-11-20 DIAGNOSIS — R519 Headache, unspecified: Secondary | ICD-10-CM

## 2021-11-21 ENCOUNTER — Encounter: Payer: Self-pay | Admitting: Nurse Practitioner

## 2021-11-30 ENCOUNTER — Ambulatory Visit (INDEPENDENT_AMBULATORY_CARE_PROVIDER_SITE_OTHER): Payer: BC Managed Care – PPO | Admitting: Nurse Practitioner

## 2021-11-30 ENCOUNTER — Other Ambulatory Visit: Payer: Self-pay

## 2021-11-30 VITALS — BP 128/70 | HR 85 | Temp 99.0°F | Ht 67.8 in | Wt 224.0 lb

## 2021-11-30 DIAGNOSIS — Z13228 Encounter for screening for other metabolic disorders: Secondary | ICD-10-CM

## 2021-11-30 DIAGNOSIS — Z1159 Encounter for screening for other viral diseases: Secondary | ICD-10-CM

## 2021-11-30 DIAGNOSIS — Z0000 Encounter for general adult medical examination without abnormal findings: Secondary | ICD-10-CM

## 2021-11-30 DIAGNOSIS — E6609 Other obesity due to excess calories: Secondary | ICD-10-CM

## 2021-11-30 DIAGNOSIS — R519 Headache, unspecified: Secondary | ICD-10-CM

## 2021-11-30 DIAGNOSIS — R768 Other specified abnormal immunological findings in serum: Secondary | ICD-10-CM

## 2021-11-30 DIAGNOSIS — J302 Other seasonal allergic rhinitis: Secondary | ICD-10-CM

## 2021-11-30 DIAGNOSIS — Z6834 Body mass index (BMI) 34.0-34.9, adult: Secondary | ICD-10-CM

## 2021-11-30 DIAGNOSIS — G8929 Other chronic pain: Secondary | ICD-10-CM

## 2021-11-30 DIAGNOSIS — R21 Rash and other nonspecific skin eruption: Secondary | ICD-10-CM

## 2021-11-30 MED ORDER — UBRELVY 50 MG PO TABS: 1.0000 | ORAL_TABLET | Freq: Every day | ORAL | 2 refills | Status: AC | PRN

## 2021-11-30 NOTE — Progress Notes (Signed)
I,Tianna Badgett,acting as a Education administrator for [employer], NP.,have documented all relevant documentation on the behalf of [employer], NP,as directed by  Bary Castilla, NP while in the presence of Bary Castilla, NP.  This visit occurred during the SARS-CoV-2 public health emergency.  Safety protocols were in place, including screening questions prior to the visit, additional usage of staff PPE, and extensive cleaning of exam room while observing appropriate contact time as indicated for disinfecting solutions.  Subjective:     Patient ID: Melanie Brooks , female    DOB: 1986/06/26 , 36 y.o.   MRN: [id number]   Chief Complaint  Patient presents with   Annual Exam    HPI  Patient is here for physical exam. She is still having some headache. She is trying to reduce her stress level. She is trying to rest well. She does not eat pork.  She is a Oncologist.  She does have nuvaring. She is not sexually active currently.  Diet: She is not on a diet. Eats in porion control.  Exercise: She walks and does exercise.  She goes to the dentist and eye doctor  She does not drink or smoke.  She does not have any other concerns at this time. Her mom does have lupus. She was tested for lupus 2 years ago and was referred to a rheumatologist but never heard from them. She would like another referral today for them.     No past medical history on file.   Family History  Problem Relation Age of Onset   Breast cancer Cousin      Current Outpatient Medications:    acetaminophen (TYLENOL) 500 MG tablet, Take 500 mg by mouth every 6 (six) hours as needed., Disp: , Rfl:    fexofenadine (ALLEGRA) 60 MG tablet, Take 1 tablet (60 mg total) by mouth daily., Disp: 90 tablet, Rfl: 1   levocetirizine (XYZAL) 5 MG tablet, Take 1 tablet (5 mg total) by mouth every evening., Disp: 90 tablet, Rfl: 1   Ubrogepant (UBRELVY) 50 MG TABS, Take 1 tablet by mouth daily as needed., Disp: 30  tablet, Rfl: 2   Allergies  Allergen Reactions   Excedrin Pm [Diphenhydramine-Apap (Sleep)] Hives      The patient states she uses NuvaRing vaginal inserts for birth control. Last LMP was No LMP recorded. Patient has had an implant.. Negative for Dysmenorrhea. Negative for: breast discharge, breast lump(s), breast pain and breast self exam. Associated symptoms include abnormal vaginal bleeding. Pertinent negatives include abnormal bleeding (hematology), anxiety, decreased libido, depression, difficulty falling sleep, dyspareunia, history of infertility, nocturia, sexual dysfunction, sleep disturbances, urinary incontinence, urinary urgency, vaginal discharge and vaginal itching. Diet regular.The patient states her exercise level is    . The patient's tobacco use is:  Social History   Tobacco Use  Smoking Status Never  Smokeless Tobacco Never  . She has been exposed to passive smoke. The patient's alcohol use is:  Social History   Substance and Sexual Activity  Alcohol Use Yes   Comment: socially  . Additional information: Last pap 2022, next one scheduled for 2025.    Review of Systems  Constitutional: Negative.  Negative for chills and fever.  HENT: Negative.  Negative for congestion.   Eyes: Negative.   Respiratory: Negative.  Negative for cough, shortness of breath and wheezing.   Cardiovascular: Negative.  Negative for chest pain and palpitations.  Gastrointestinal: Negative.  Negative for constipation and diarrhea.  Endocrine: Negative.   Genitourinary: Negative.  Musculoskeletal: Negative.  Negative for arthralgias and myalgias.  Skin:  Positive for rash.  Allergic/Immunologic: Negative.   Neurological:  Positive for headaches. Negative for dizziness, weakness and numbness.  Hematological: Negative.   Psychiatric/Behavioral: Negative.      Today's Vitals   11/30/21 1545  BP: 128/70  Pulse: 85  Temp: 99 F (37.2 C)  TempSrc: Oral  Weight: 224 lb (101.6 kg)   Height: 5' 7.8" (1.722 m)   Body mass index is 34.26 kg/m.  Wt Readings from Last 3 Encounters:  11/30/21 224 lb (101.6 kg)  02/15/20 222 lb (100.7 kg)  09/21/19 223 lb 3.2 oz (101.2 kg)    Objective:  Physical Exam Vitals and nursing note reviewed.  Constitutional:      Appearance: Normal appearance.  HENT:     Head: Normocephalic and atraumatic.     Right Ear: Tympanic membrane, ear canal and external ear normal. There is no impacted cerumen.     Left Ear: Tympanic membrane, ear canal and external ear normal. There is no impacted cerumen.     Nose:     Comments: Masked     Mouth/Throat:     Comments: Masked  Eyes:     Extraocular Movements: Extraocular movements intact.     Conjunctiva/sclera: Conjunctivae normal.     Pupils: Pupils are equal, round, and reactive to light.  Cardiovascular:     Rate and Rhythm: Normal rate and regular rhythm.     Pulses: Normal pulses.     Heart sounds: Normal heart sounds. No murmur heard. Pulmonary:     Effort: Pulmonary effort is normal. No respiratory distress.     Breath sounds: Normal breath sounds.  Chest:  Breasts:    Tanner Score is 5.     Right: Normal.     Left: Normal.  Abdominal:     General: Abdomen is flat. Bowel sounds are normal.     Palpations: Abdomen is soft.  Genitourinary:    Comments: deferred Musculoskeletal:        General: Normal range of motion.     Cervical back: Normal range of motion and neck supple.  Skin:    General: Skin is warm and dry.     Capillary Refill: Capillary refill takes less than 2 seconds.     Findings: Rash present.     Comments: Rash under breast area   Neurological:     General: No focal deficit present.     Mental Status: She is alert and oriented to person, place, and time.  Psychiatric:        Mood and Affect: Mood normal.        Behavior: Behavior normal.        Assessment And Plan:     1. Encounter for annual physical exam --Patient is here for their annual  physical exam and we discussed any changes to medication and medical history.  -Behavior modification was discussed as well as diet and exercise history  -Patient will continue to exercise regularly and modify their diet.  -Recommendation for yearly physical annuals, immunization and screenings including mammogram and colonoscopy were discussed with the patient.  -Recommended intake of multivitamin, vitamin D and calcium.  -Individualized advise was given to the patient pertaining to their own health history in regards to diet, exercise, medical condition and referrals.  - CBC - CMP14+EGFR  2. Chronic nonintractable headache, unspecified headache type -She takes ubrelvy 50 mg as needed for headaches.  -Advised patient to reduce her stress  and use relaxation methods.  - Ubrogepant (UBRELVY) 50 MG TABS; Take 1 tablet by mouth daily as needed.  Dispense: 30 tablet; Refill: 2  3. Class 1 obesity due to excess calories without serious comorbidity with body mass index (BMI) of 34.0 to 34.9 in adult -Advised patient on a healthy diet including avoiding fast food and red meats. Increase the intake of lean meats including grilled chicken and [place].  Drink a lot of water. Decrease intake of fatty foods. Exercise for 30-45 min. 4-5 a week to decrease the risk of cardiac event.   4. Seasonal allergies -Pt. Does takes allegra.  -She would like to see an allergist for allergy testing and allergy shots.  - Ambulatory referral to Allergy  5. ANA positive -Hx of Lupus in her mother  -Tested ANA positive in the past  - Ambulatory referral to Rheumatology - ANA, IFA (with reflex)  6. Rash - Ambulatory referral to Rheumatology - ANA, IFA (with reflex)  7. Encounter for screening for metabolic disorder -Will test and assess her for any metabolic disorders.  - Hemoglobin A1c - Lipid panel - TSH + free T4  8. Encounter for hepatitis C screening test for low risk patient - Hepatitis C antibody  The  patient was encouraged to call or send a message through [facility] for any questions or concerns.   Follow up: if symptoms persist or do not get better.   Side effects and appropriate use of all the medication(s) were discussed with the patient today. Patient advised to use the medication(s) as directed by their healthcare provider. The patient was encouraged to read, review, and understand all associated package inserts and contact our office with any questions or concerns. The patient accepts the risks of the treatment plan and had an opportunity to ask questions.   Staying healthy and adopting a healthy lifestyle for your overall health is important. You should eat 7 or more servings of fruits and vegetables per day. You should drink plenty of water to keep yourself hydrated and your kidneys healthy. This includes about 65-80+ fluid ounces of water. Limit your intake of animal fats especially for elevated cholesterol. Avoid highly processed food and limit your salt intake if you have hypertension. Avoid foods high in saturated/Trans fats. Along with a healthy diet it is also very important to maintain time for yourself to maintain a healthy mental health with low stress levels. You should get atleast 150 min of moderate intensity exercise weekly for a healthy heart. Along with eating right and exercising, aim for at least 7-9 hours of sleep daily.  Eat more whole grains which includes barley, wheat berries, oats, brown rice and whole wheat pasta. Use healthy plant oils which include olive, soy, corn, sunflower and peanut. Limit your caffeine and sugary drinks. Limit your intake of fast foods. Limit milk and dairy products to one or two daily servings.   Patient was given opportunity to ask questions. Patient verbalized understanding of the plan and was able to repeat key elements of the plan. All questions were answered to their satisfaction.  [place], DNP   I, Raman Jkai Arwood have reviewed all  documentation for this visit. The documentation on 11/30/20 for the exam, diagnosis, procedures, and orders are all accurate and complete.    THE PATIENT IS ENCOURAGED TO PRACTICE SOCIAL DISTANCING DUE TO THE COVID-19 PANDEMIC.

## 2021-11-30 NOTE — Patient Instructions (Signed)

## 2021-12-01 LAB — HEPATITIS C ANTIBODY: Hep C Virus Ab: <0.1 s/co ratio (ref 0.0–0.9)

## 2021-12-03 LAB — CMP14+EGFR
ALT: 10 IU/L (ref 0–32)
AST: 16 IU/L (ref 0–40)
Albumin/Globulin Ratio: 1.2 (ref 1.2–2.2)
Albumin: 4.1 g/dL (ref 3.8–4.8)
Alkaline Phosphatase: 63 IU/L (ref 44–121)
BUN/Creatinine Ratio: 9 (ref 9–23)
BUN: 8 mg/dL (ref 6–20)
Bilirubin Total: 0.2 mg/dL (ref 0.0–1.2)
CO2: 22 mmol/L (ref 20–29)
Calcium: 9.2 mg/dL (ref 8.7–10.2)
Chloride: 103 mmol/L (ref 96–106)
Creatinine, Ser: 0.86 mg/dL (ref 0.57–1.00)
Globulin, Total: 3.5 g/dL (ref 1.5–4.5)
Glucose: 87 mg/dL (ref 70–99)
Potassium: 4.1 mmol/L (ref 3.5–5.2)
Sodium: 139 mmol/L (ref 134–144)
Total Protein: 7.6 g/dL (ref 6.0–8.5)
eGFR: 89 mL/min/{1.73_m2} (ref >59)

## 2021-12-03 LAB — CBC
Hematocrit: 34.9 % (ref 34.0–46.6)
Hemoglobin: 12.2 g/dL (ref 11.1–15.9)
MCH: 30.3 pg (ref 26.6–33.0)
MCHC: 35 g/dL (ref 31.5–35.7)
MCV: 87 fL (ref 79–97)
Platelets: 303 10*3/uL (ref 150–450)
RBC: 4.03 x10E6/uL (ref 3.77–5.28)
RDW: 12.1 % (ref 11.7–15.4)
WBC: 4.3 10*3/uL (ref 3.4–10.8)

## 2021-12-03 LAB — HEMOGLOBIN A1C
Est. average glucose Bld gHb Est-mCnc: 131 mg/dL
Hgb A1c MFr Bld: 6.2 % — ABNORMAL HIGH (ref 4.8–5.6)

## 2021-12-03 LAB — ANTINUCLEAR ANTIBODIES, IFA: ANA Titer 1: NEGATIVE

## 2021-12-03 LAB — LIPID PANEL
Chol/HDL Ratio: 2.4 ratio (ref 0.0–4.4)
Cholesterol, Total: 149 mg/dL (ref 100–199)
HDL: 63 mg/dL (ref >39)
LDL Chol Calc (NIH): 67 mg/dL (ref 0–99)
Triglycerides: 105 mg/dL (ref 0–149)
VLDL Cholesterol Cal: 19 mg/dL (ref 5–40)

## 2021-12-03 LAB — TSH+FREE T4
Free T4: 1.19 ng/dL (ref 0.82–1.77)
TSH: 1.09 u[IU]/mL (ref 0.450–4.500)

## 2021-12-06 ENCOUNTER — Other Ambulatory Visit: Payer: Self-pay | Admitting: Nurse Practitioner

## 2022-01-22 ENCOUNTER — Encounter: Payer: Self-pay | Admitting: Rheumatology

## 2022-01-22 ENCOUNTER — Other Ambulatory Visit: Payer: Self-pay | Admitting: Nurse Practitioner

## 2022-01-22 ENCOUNTER — Encounter: Payer: Self-pay | Admitting: Nurse Practitioner

## 2022-01-22 MED ORDER — METFORMIN HCL 500 MG PO TABS: 500.0000 mg | ORAL_TABLET | Freq: Every day | ORAL | 1 refills | Status: DC

## 2022-01-24 ENCOUNTER — Other Ambulatory Visit: Payer: Self-pay | Admitting: Nurse Practitioner

## 2022-01-24 DIAGNOSIS — E6609 Other obesity due to excess calories: Secondary | ICD-10-CM

## 2022-01-24 DIAGNOSIS — Z6834 Body mass index (BMI) 34.0-34.9, adult: Secondary | ICD-10-CM

## 2022-01-24 MED ORDER — SAXENDA 18 MG/3ML ~~LOC~~ SOPN: 3.0000 mg | PEN_INJECTOR | Freq: Every day | SUBCUTANEOUS | 1 refills | Status: DC

## 2022-01-25 ENCOUNTER — Other Ambulatory Visit: Payer: Self-pay

## 2022-01-25 DIAGNOSIS — Z6834 Body mass index (BMI) 34.0-34.9, adult: Secondary | ICD-10-CM

## 2022-01-25 MED ORDER — SAXENDA 18 MG/3ML ~~LOC~~ SOPN: 3.0000 mg | PEN_INJECTOR | Freq: Every day | SUBCUTANEOUS | 1 refills | Status: DC

## 2022-01-26 ENCOUNTER — Telehealth: Payer: Self-pay

## 2022-01-26 NOTE — Telephone Encounter (Signed)
PA completed, waiting for determination.  ?

## 2022-02-02 NOTE — Progress Notes (Addendum)
? ?Office Visit Note ? ?Patient: Melanie Brooks             ?Date of Birth: 10-19-1986           ?MRN: [id number]             ?PCP: [name], FNP ?Referring: Charlesetta Ivory, NP ?Visit Date: 02/16/2022 ?Occupation: @GUAROCC @ ? ?Subjective:  ?Hyperpigmentation on the face ? ?History of Present Illness: Melanie Brooks is a 36 y.o. female seen in consultation per request of her PCP.  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  She was on a drug study by Dr. 2007 at [facility].  According the patient after the study she lost pigmentation almost all over her body which gradually came back.  2 to 3 years later the hair grew back.  She had another episode of hair loss about 3 to 4 years ago which was mostly on her scalp.  She had steroid injections by [facility]. [facility] at [facility] and the scalp hair grew back.  About 4 years ago she noticed hyperpigmentation on her face.  She was seen by [facility]. [facility] and was given a prescription for hydroquinone.  She states that she used it for 1 year and then stopped using it.  She continues to have hyperpigmentation on her face.  She denies any history of oral ulcers, nasal ulcers, malar rash, sicca symptoms, Raynaud's phenomenon or lymphadenopathy.  There is no history of inflammatory arthritis or joint pain.  She gives history of photosensitivity. ? ?Activities of Daily Living:  ?Patient reports morning stiffness for 0  none .   ?Patient Denies nocturnal pain.  ?Difficulty dressing/grooming: Denies ?Difficulty climbing stairs: Reports ?Difficulty getting out of chair: Denies ?Difficulty using hands for taps, buttons, cutlery, and/or writing: Reports ? ?Review of Systems  ?Constitutional:  Positive for fatigue.  ?HENT:  Positive for mouth dryness.   ?Eyes:  Positive for dryness.  ?Respiratory:  Negative for shortness of breath.   ?Cardiovascular:  Negative for swelling in legs/feet.   ?Gastrointestinal:  Negative for constipation.  ?Endocrine: Positive for excessive thirst.  ?Genitourinary:  Negative for difficulty urinating.  ?Musculoskeletal:  Negative for joint pain, joint pain, myalgias and myalgias.  ?Skin:  Positive for color change and sensitivity to sunlight. Negative for rash.  ?Allergic/Immunologic: Negative for susceptible to infections.  ?Neurological:  Negative for numbness.  ?Hematological:  Positive for bruising/bleeding tendency. Negative for swollen glands.  ?Psychiatric/Behavioral:  Positive for sleep disturbance. Negative for depressed mood. The patient is nervous/anxious.   ? ?PMFS History:  ?Patient Active Problem List  ? Diagnosis Date Noted  ? Seasonal allergies 02/16/2022  ? Family history of systemic lupus erythematosus (SLE) in mother 02/16/2022  ? History of asthma 02/16/2022  ? Hyperpigmentation 02/16/2022  ?  ?Past Medical History:  ?Diagnosis Date  ? Anemia   ? Asthma   ?  ?Family History  ?Problem Relation Age of Onset  ? Lupus Mother   ? Thyroid disease Mother   ? Breast cancer Cousin   ? ?History reviewed. No pertinent surgical history. ?Social History  ? ?Social History Narrative  ? Not on file  ? ?Immunization History  ?Administered Date(s) Administered  ? Janssen (J&J) SARS-COV-2 Vaccination 02/06/2020  ? Tdap 11/29/2018  ?  ? ?Objective: ?Vital Signs: BP 126/77 (BP Location: Right Arm, Patient Position: Sitting, Cuff Size: Normal)   Pulse 71   Resp 15   Ht 5'  7.75" (1.721 m)   Wt 218 lb (98.9 kg)   BMI 33.39 kg/m?   ? ?Physical Exam ?Vitals and nursing note reviewed.  ?Constitutional:   ?   Appearance: She is well-developed.  ?HENT:  ?   Head: Normocephalic and atraumatic.  ?Eyes:  ?   Conjunctiva/sclera: Conjunctivae normal.  ?Cardiovascular:  ?   Rate and Rhythm: Normal rate and regular rhythm.  ?   Heart sounds: Normal heart sounds.  ?Pulmonary:  ?   Effort: Pulmonary effort is normal.  ?   Breath sounds: Normal breath sounds.  ?Abdominal:  ?    General: Bowel sounds are normal.  ?   Palpations: Abdomen is soft.  ?Musculoskeletal:  ?   Cervical back: Normal range of motion.  ?Lymphadenopathy:  ?   Cervical: No cervical adenopathy.  ?Skin: ?   General: Skin is warm and dry.  ?   Capillary Refill: Capillary refill takes less than 2 seconds.  ?   Comments: Hyperpigmentation was noted on her forehead, cheeks and her chin.  ?Neurological:  ?   Mental Status: She is alert and oriented to person, place, and time.  ?Psychiatric:     ?   Behavior: Behavior normal.  ?  ? ?Musculoskeletal Exam: C-spine thoracic and lumbar spine were in good range of motion.  Shoulder joints, elbow joints, wrist joints, MCPs PIPs and DIPs and good range of motion with no synovitis.  Hip joints, knee joints, ankles, MTPs and PIPs with good range of motion with no synovitis. ? ?CDAI Exam: ?CDAI Score: -- ?Patient Global: --; Provider Global: -- ?Swollen: --; Tender: -- ?Joint Exam 02/16/2022  ? ?No joint exam has been documented for this visit  ? ?There is currently no information documented on the homunculus. Go to the Rheumatology activity and complete the homunculus joint exam. ? ?Investigation: ?No additional findings. ? ?Imaging: ?No results found. ? ?Recent Labs: ?Lab Results  ?Component Value Date  ? WBC 4.3 11/30/2021  ? HGB 12.2 11/30/2021  ? PLT 303 11/30/2021  ? NA 139 11/30/2021  ? K 4.1 11/30/2021  ? CL 103 11/30/2021  ? CO2 22 11/30/2021  ? GLUCOSE 87 11/30/2021  ? BUN 8 11/30/2021  ? CREATININE 0.86 11/30/2021  ? BILITOT 0.2 11/30/2021  ? ALKPHOS 63 11/30/2021  ? AST 16 11/30/2021  ? ALT 10 11/30/2021  ? PROT 7.6 11/30/2021  ? ALBUMIN 4.1 11/30/2021  ? CALCIUM 9.2 11/30/2021  ? GFRAA 86 06/15/2019  ? ? ? ?Speciality Comments: No specialty comments available. ? ?Procedures:  ?No procedures performed ?Allergies: Bee venom, Molds & smuts, Other, Pollen extract, Cat hair extract, Diphenhydramine-acetaminophen, Dog epithelium allergy skin test, Excedrin pm [diphenhydramine-apap  (sleep)], and Honey bee venom  ? ?Assessment / Plan:     ?Visit Diagnoses: Hyperpigmentation - November 30, 2021 ANA negative.  Patient states that she started developing hyperpigmentation on her face about 4 years ago.  [facility] who gave her a prescription for hydroquinone.  She used topical hydroquinone for about 1 year and then stopped as she did not notice any improvement.  She continues to have hyperpigmentation.  Hyperpigmentation was noted on her forehead, cheeks and her chin.  No other rashes were noted on the examination.  Per patient's request I gave her a refill on hydroquinone.  I also advised her to use a tinted sunscreen.  Use of topical vitamin C and E serum was advised.  She has no clinical features of lupus.  Her ANA was negative.  Advised to follow-up appointment with either Dr. Caryn Section or Dr. Randye Lobo.  She has seen Dr. Christin Bach in the past for alopecia areata.   ? ?Rash -her ANA was negative in the past.  I will obtain additional labs today.  I will contact her once the lab results are available.  Plan: Sjogrens syndrome-A extractable nuclear antibody, Sjogrens syndrome-B extractable nuclear antibody, C3 and C4, Anti-DNA antibody, double-stranded ? ?History of alopecia areata-she was diagnosed with alopecia areata in 2006.  She states for over a year she lost all of her body hair.  She was in study with Dr. Randye Lobo at [facility].  She states that 3 to 4 years after being on the study drug she started having hair growth.  She also lost skin pigmentation all over her body for few years which gradually came back.  She had another episode of alopecia areata about 4 years ago for which she was seen by Dr. Caryn Section at [facility].  It was mostly localized to her scalp.  It responded to steroid injections. ? ?History of asthma-patient gives history of intermittent asthma. ? ?Seasonal allergies-treated with Allegra. ? ?Family history of systemic lupus erythematosus (SLE) in mother  - Maternal uncle has cutaneous lupus ? ?Orders: ?Orders Placed This Encounter  ?Procedures  ? Sjogrens syndrome-A extractable nuclear antibody  ? Sjogrens syndrome-B extractable nuclear antibody  ? C3 and C4  ? Anti-DNA

## 2022-02-09 ENCOUNTER — Other Ambulatory Visit: Payer: Self-pay | Admitting: Nurse Practitioner

## 2022-02-09 DIAGNOSIS — E6609 Other obesity due to excess calories: Secondary | ICD-10-CM

## 2022-02-14 ENCOUNTER — Other Ambulatory Visit: Payer: Self-pay

## 2022-02-14 MED ORDER — INSULIN PEN NEEDLE 32G X 6 MM MISC: 3 refills | Status: AC

## 2022-02-16 ENCOUNTER — Ambulatory Visit: Payer: BC Managed Care – PPO | Admitting: Rheumatology

## 2022-02-16 ENCOUNTER — Encounter: Payer: Self-pay | Admitting: *Deleted

## 2022-02-16 ENCOUNTER — Encounter: Payer: Self-pay | Admitting: Rheumatology

## 2022-02-16 VITALS — BP 126/77 | HR 71 | Resp 15 | Ht 67.75 in | Wt 218.0 lb

## 2022-02-16 DIAGNOSIS — R21 Rash and other nonspecific skin eruption: Secondary | ICD-10-CM | POA: Diagnosis not present

## 2022-02-16 DIAGNOSIS — R768 Other specified abnormal immunological findings in serum: Secondary | ICD-10-CM

## 2022-02-16 DIAGNOSIS — Z872 Personal history of diseases of the skin and subcutaneous tissue: Secondary | ICD-10-CM | POA: Diagnosis not present

## 2022-02-16 DIAGNOSIS — Z8269 Family history of other diseases of the musculoskeletal system and connective tissue: Secondary | ICD-10-CM

## 2022-02-16 DIAGNOSIS — L819 Disorder of pigmentation, unspecified: Secondary | ICD-10-CM | POA: Diagnosis not present

## 2022-02-16 DIAGNOSIS — J302 Other seasonal allergic rhinitis: Secondary | ICD-10-CM

## 2022-02-16 DIAGNOSIS — Z8709 Personal history of other diseases of the respiratory system: Secondary | ICD-10-CM | POA: Diagnosis not present

## 2022-02-16 MED ORDER — HYDROQUINONE 4 % EX CREA: TOPICAL_CREAM | CUTANEOUS | 1 refills | Status: DC

## 2022-02-17 ENCOUNTER — Encounter: Payer: Self-pay | Admitting: Rheumatology

## 2022-02-18 ENCOUNTER — Other Ambulatory Visit: Payer: Self-pay | Admitting: Rheumatology

## 2022-02-18 NOTE — Progress Notes (Signed)
Please add Sm, RNP, Scl-70 and ANA.

## 2022-02-21 LAB — TEST AUTHORIZATION

## 2022-02-21 LAB — SM AND SM/RNP ANTIBODIES
ENA SM Ab Ser-aCnc: <1 AI
SM/RNP: <1 AI

## 2022-02-21 LAB — C3 AND C4
C3 Complement: 80 mg/dL — ABNORMAL LOW (ref 83–193)
C4 Complement: 36 mg/dL (ref 15–57)

## 2022-02-21 LAB — ANTI-NUCLEAR AB-TITER (ANA TITER): ANA Titer 1: 1:80 {titer} — ABNORMAL HIGH

## 2022-02-21 LAB — ANTI-DNA ANTIBODY, DOUBLE-STRANDED: ds DNA Ab: <1 IU/mL

## 2022-02-21 LAB — ANA: Anti Nuclear Antibody (ANA): POSITIVE — AB

## 2022-02-21 LAB — SJOGRENS SYNDROME-A EXTRACTABLE NUCLEAR ANTIBODY: SSA (Ro) (ENA) Antibody, IgG: >8 AI — AB

## 2022-02-21 LAB — SJOGRENS SYNDROME-B EXTRACTABLE NUCLEAR ANTIBODY: SSB (La) (ENA) Antibody, IgG: 1.9 AI — AB

## 2022-02-21 LAB — ANTI-SCLERODERMA ANTIBODY: Scleroderma (Scl-70) (ENA) Antibody, IgG: <1 AI

## 2022-02-21 NOTE — Telephone Encounter (Signed)
Some of the labs came positive.  She will need a follow-up appointment to discuss those results.  Please schedule a follow-up appointment.

## 2022-02-25 NOTE — Progress Notes (Signed)
? ?Office Visit Note ? ?Patient: Melanie Brooks             ?Date of Birth: [birth year]           ?MRN: [id number]             ?PCP: [name], FNP ?Referring: [name], FNP ?Visit Date: 03/08/2022 ?Occupation: @GUAROCC @ ? ?Subjective:  ?Hyperpigmentation ? ?History of Present Illness: Melanie Brooks is a 36 y.o. female with history of hyperpigmentation on her face.  She states she could not get the prescription for hydroquinone.  She also gives history of intermittent rash.  She gives history of dry mouth and dry eyes.  She states her dry mouth dry eye symptoms are mild and not persistent.  She has been experiencing intermittent palpitations. ? ?Activities of Daily Living:  ?Patient reports morning stiffness for 0 minutes.   ?Patient Denies nocturnal pain.  ?Difficulty dressing/grooming: Denies ?Difficulty climbing stairs: Denies ?Difficulty getting out of chair: Denies ?Difficulty using hands for taps, buttons, cutlery, and/or writing: Reports ? ?Review of Systems  ?Constitutional:  Positive for fatigue.  ?HENT:  Positive for mouth dryness and nose dryness. Negative for mouth sores.   ?Eyes:  Positive for itching and dryness. Negative for pain.  ?Respiratory:  Negative for shortness of breath and difficulty breathing.   ?Cardiovascular:  Negative for chest pain and palpitations.  ?Gastrointestinal:  Negative for blood in stool, constipation and diarrhea.  ?Endocrine: Negative for increased urination.  ?Genitourinary:  Negative for difficulty urinating.  ?Musculoskeletal:  Negative for joint pain, joint pain, joint swelling, myalgias, morning stiffness, muscle tenderness and myalgias.  ?Skin:  Negative for color change, rash and redness.  ?Allergic/Immunologic: Positive for susceptible to infections.  ?Neurological:  Positive for headaches. Negative for dizziness, numbness and memory loss.  ?Hematological:  Positive for bruising/bleeding tendency.  ?Psychiatric/Behavioral:  Negative for  confusion.   ? ?PMFS History:  ?Patient Active Problem List  ? Diagnosis Date Noted  ? Seasonal allergies 02/16/2022  ? Family history of systemic lupus erythematosus (SLE) in mother 02/16/2022  ? History of asthma 02/16/2022  ? Hyperpigmentation 02/16/2022  ?  ?Past Medical History:  ?Diagnosis Date  ? Anemia   ? Asthma   ?  ?Family History  ?Problem Relation Age of Onset  ? Lupus Mother   ? Thyroid disease Mother   ? Breast cancer Cousin   ? ?History reviewed. No pertinent surgical history. ?Social History  ? ?Social History Narrative  ? Not on file  ? ?Immunization History  ?Administered Date(s) Administered  ? Janssen (J&J) SARS-COV-2 Vaccination 02/06/2020  ? Tdap 11/29/2018  ?  ? ?Objective: ?Vital Signs: BP 118/81 (BP Location: Left Arm, Patient Position: Sitting, Cuff Size: Normal)   Pulse 83   Ht 5\' 8"  (1.727 m)   Wt 210 lb 12.8 oz (95.6 kg)   BMI 32.05 kg/m?   ? ?Physical Exam ?Vitals and nursing note reviewed.  ?Constitutional:   ?   Appearance: She is well-developed.  ?HENT:  ?   Head: Normocephalic and atraumatic.  ?Eyes:  ?   Conjunctiva/sclera: Conjunctivae normal.  ?Cardiovascular:  ?   Rate and Rhythm: Normal rate and regular rhythm.  ?   Heart sounds: Normal heart sounds.  ?Pulmonary:  ?   Effort: Pulmonary effort is normal.  ?   Breath sounds: Normal breath sounds.  ?Abdominal:  ?   General: Bowel sounds are normal.  ?   Palpations: Abdomen is soft.  ?Musculoskeletal:  ?  Cervical back: Normal range of motion.  ?Lymphadenopathy:  ?   Cervical: No cervical adenopathy.  ?Skin: ?   General: Skin is warm and dry.  ?   Capillary Refill: Capillary refill takes less than 2 seconds.  ?   Comments: Hyperpigmentation noted on her forehead, cheeks and her chin.  ?Neurological:  ?   Mental Status: She is alert and oriented to person, place, and time.  ?Psychiatric:     ?   Behavior: Behavior normal.  ?  ? ?Musculoskeletal Exam: C-spine was in good range of motion.  Shoulder joints, elbow joints, wrist  joints, MCPs PIPs and DIPs with good range of motion with no synovitis.  Hip joints, knee joints, ankles, MTPs and PIPs with good range of motion with no synovitis. ? ?CDAI Exam: ?CDAI Score: -- ?Patient Global: --; Provider Global: -- ?Swollen: --; Tender: -- ?Joint Exam 03/08/2022  ? ?No joint exam has been documented for this visit  ? ?There is currently no information documented on the homunculus. Go to the Rheumatology activity and complete the homunculus joint exam. ? ?Investigation: ?No additional findings. ? ?Imaging: ?No results found. ? ?Recent Labs: ?Lab Results  ?Component Value Date  ? WBC 4.3 11/30/2021  ? HGB 12.2 11/30/2021  ? PLT 303 11/30/2021  ? NA 139 11/30/2021  ? K 4.1 11/30/2021  ? CL 103 11/30/2021  ? CO2 22 11/30/2021  ? GLUCOSE 87 11/30/2021  ? BUN 8 11/30/2021  ? CREATININE 0.86 11/30/2021  ? BILITOT 0.2 11/30/2021  ? ALKPHOS 63 11/30/2021  ? AST 16 11/30/2021  ? ALT 10 11/30/2021  ? PROT 7.6 11/30/2021  ? ALBUMIN 4.1 11/30/2021  ? CALCIUM 9.2 11/30/2021  ? GFRAA 86 06/15/2019  ? ?February 16, 2022 ANA 1: 80NS, SSA positive, SSB positive (dsDNA negative, Smith negative, RNP negative, SCL 70 negative), C3 80, C4 36 ? ?Speciality Comments: No specialty comments available. ? ?Procedures:  ?No procedures performed ?Allergies: Bee venom, Molds & smuts, Other, Pollen extract, Cat hair extract, Diphenhydramine-acetaminophen, Dog epithelium allergy skin test, Excedrin pm [diphenhydramine-apap (sleep)], and Honey bee venom  ? ?Assessment / Plan:     ?Visit Diagnoses: Sjogren's syndrome with other organ involvement (HCC) - ANA 1: 80NS, positive SSA, positive SSB, low C3 -I had a detailed discussion with the patient regarding the labs.  She also has mild dry mouth and dry eye symptoms.  Detailed counsel regarding Sjogren's was provided.  Association of Ro antibody with arrhythmias and cardiac conduction abnormality and fetus were discussed.  Patient states she has been experiencing palpitations.  I will  refer her to cardiology for further evaluation.  She is planning to get married next year and may want to have another child in the future.  I did detailed discussion with her regarding starting on hydroxychloroquine prior to pregnancy.  A handout on hydroxychloroquine was given for review.  Plan: Ambulatory referral to Cardiology, Ambulatory referral to Dermatology.  Patient was in a hurry today.  She plans to discuss Sjogren's and future use of hydroxychloroquine at the follow-up visit.  Over-the-counter products for sicca symptoms were discussed at length. ? ?Positive ANA (antinuclear antibody) - ANA 1: 80NS, positive SSA, positive SSB, low C3.  She denies history of sicca symptoms. -Left findings were discussed with the patient at length.  I will obtain additional labs.  Patient plans to come tomorrow for the lab work.  Plan: Urinalysis, Routine w reflex microscopic, Sedimentation rate, Rheumatoid factor, Beta-2 glycoprotein antibodies, Cardiolipin antibodies, IgG, IgM, IgA,  Lupus Anticoagulant Eval w/Reflex, Ambulatory referral to Dermatology ? ?Hyperpigmentation - History of hyperpigmentation for the last 4 years.  She was treated by Dr. Caryn Section (a dermatologist at [facility]) with topical hydroquinone in the past. -Patient was referred to Dr. Randye Lobo at [facility] as she has seen Dr. Randye Lobo in the past.  Per patient's request the prescription for hydrocodone was sent again.  She has been using sinus Raynauds discussed during the last visit.  Plan: hydroquinone 4 % cream, Ambulatory referral to Dermatology ? ?Rash - Plan: CK ? ?Other fatigue -she expressed has been experiencing minor fatigue.  I will obtain following labs.  Plan: Serum protein electrophoresis with reflex, Glucose 6 phosphate dehydrogenase ? ?Palpitations -she gives history of intermittent palpitations.  Her heart rate was regular today.  Plan: Ambulatory referral to Cardiology ? ?History of alopecia areata - Diagnosed in 2006 by Dr.  Randye Lobo.  She was in a drug study which caused reversible generalized hypopigmentation and her hair regrew per patient. ? ?History of asthma ? ?Seasonal allergies ? ?Family history of systemic lupus erythematosu

## 2022-03-08 ENCOUNTER — Ambulatory Visit: Payer: BC Managed Care – PPO | Admitting: Rheumatology

## 2022-03-08 ENCOUNTER — Ambulatory Visit (INDEPENDENT_AMBULATORY_CARE_PROVIDER_SITE_OTHER): Payer: BC Managed Care – PPO | Admitting: Rheumatology

## 2022-03-08 ENCOUNTER — Encounter: Payer: Self-pay | Admitting: Rheumatology

## 2022-03-08 VITALS — BP 118/81 | HR 83 | Ht 68.0 in | Wt 210.8 lb

## 2022-03-08 DIAGNOSIS — J302 Other seasonal allergic rhinitis: Secondary | ICD-10-CM

## 2022-03-08 DIAGNOSIS — R768 Other specified abnormal immunological findings in serum: Secondary | ICD-10-CM | POA: Diagnosis not present

## 2022-03-08 DIAGNOSIS — M3509 Sicca syndrome with other organ involvement: Secondary | ICD-10-CM

## 2022-03-08 DIAGNOSIS — L819 Disorder of pigmentation, unspecified: Secondary | ICD-10-CM

## 2022-03-08 DIAGNOSIS — R7689 Other specified abnormal immunological findings in serum: Secondary | ICD-10-CM

## 2022-03-08 DIAGNOSIS — R21 Rash and other nonspecific skin eruption: Secondary | ICD-10-CM

## 2022-03-08 DIAGNOSIS — R5383 Other fatigue: Secondary | ICD-10-CM

## 2022-03-08 DIAGNOSIS — Z8269 Family history of other diseases of the musculoskeletal system and connective tissue: Secondary | ICD-10-CM

## 2022-03-08 DIAGNOSIS — R002 Palpitations: Secondary | ICD-10-CM

## 2022-03-08 DIAGNOSIS — Z872 Personal history of diseases of the skin and subcutaneous tissue: Secondary | ICD-10-CM

## 2022-03-08 DIAGNOSIS — Z8709 Personal history of other diseases of the respiratory system: Secondary | ICD-10-CM

## 2022-03-08 MED ORDER — HYDROQUINONE 4 % EX CREA: TOPICAL_CREAM | CUTANEOUS | 1 refills | Status: AC

## 2022-03-08 NOTE — Patient Instructions (Signed)
Hydroxychloroquine Tablets ?What is this medication? ?HYDROXYCHLOROQUINE (hye drox ee KLOR oh kwin) treats autoimmune conditions, such as rheumatoid arthritis and lupus. It works by slowing down an overactive immune system. It may also be used to prevent and treat malaria. It works by killing the parasite that causes malaria. It belongs to a group of medications called DMARDs. ?This medicine may be used for other purposes; ask your health care provider or pharmacist if you have questions. ?COMMON BRAND NAME(S): Plaquenil, Quineprox ?What should I tell my care team before I take this medication? ?They need to know if you have any of these conditions: ?Diabetes ?Eye disease, vision problems ?G6PD deficiency ?Heart disease ?History of irregular heartbeat ?If you often drink alcohol ?Kidney disease ?Liver disease ?Porphyria ?Psoriasis ?An unusual or allergic reaction to chloroquine, hydroxychloroquine, other medications, foods, dyes, or preservatives ?Pregnant or trying to get pregnant ?Breast-feeding ?How should I use this medication? ?Take this medication by mouth with a glass of water. Take it as directed on the prescription label. Do not cut, crush or chew this medication. Swallow the tablets whole. Take it with food. Do not take it more than directed. Take all of this medication unless your care team tells you to stop it early. Keep taking it even if you think you are better. ?Take products with antacids in them at a different time of day than this medication. Take this medication 4 hours before or 4 hours after antacids. Talk to your care team if you have questions. ?Talk to your care team about the use of this medication in children. While this medication may be prescribed for selected conditions, precautions do apply. ?Overdosage: If you think you have taken too much of this medicine contact a poison control center or emergency room at once. ?NOTE: This medicine is only for you. Do not share this medicine with  others. ?What if I miss a dose? ?If you miss a dose, take it as soon as you can. If it is almost time for your next dose, take only that dose. Do not take double or extra doses. ?What may interact with this medication? ?Do not take this medication with any of the following: ?Cisapride ?Dronedarone ?Pimozide ?Thioridazine ?This medication may also interact with the following: ?Ampicillin ?Antacids ?Cimetidine ?Cyclosporine ?Digoxin ?Kaolin ?Medications for diabetes, like insulin, glipizide, glyburide ?Medications for seizures like carbamazepine, phenobarbital, phenytoin ?Mefloquine ?Methotrexate ?Other medications that prolong the QT interval (cause an abnormal heart rhythm) ?Praziquantel ?This list may not describe all possible interactions. Give your health care provider a list of all the medicines, herbs, non-prescription drugs, or dietary supplements you use. Also tell them if you smoke, drink alcohol, or use illegal drugs. Some items may interact with your medicine. ?What should I watch for while using this medication? ?Visit your care team for regular checks on your progress. Tell your care team if your symptoms do not start to get better or if they get worse. ?You may need blood work done while you are taking this medication. If you take other medications that can affect heart rhythm, you may need more testing. Talk to your care team if you have questions. ?Your vision may be tested before and during use of this medication. Tell your care team right away if you have any change in your eyesight. ?This medication may cause serious skin reactions. They can happen weeks to months after starting the medication. Contact your care team right away if you notice fevers or flu-like symptoms with a rash. The   rash may be red or purple and then turn into blisters or peeling of the skin. Or, you might notice a red rash with swelling of the face, lips or lymph nodes in your neck or under your arms. ?If you or your family  notice any changes in your behavior, such as new or worsening depression, thoughts of harming yourself, anxiety, or other unusual or disturbing thoughts, or memory loss, call your care team right away. ?What side effects may I notice from receiving this medication? ?Side effects that you should report to your care team as soon as possible: ?Allergic reactions--skin rash, itching, hives, swelling of the face, lips, tongue, or throat ?Aplastic anemia--unusual weakness or fatigue, dizziness, headache, trouble breathing, increased bleeding or bruising ?Change in vision ?Heart rhythm changes--fast or irregular heartbeat, dizziness, feeling faint or lightheaded, chest pain, trouble breathing ?Infection--fever, chills, cough, or sore throat ?Low blood sugar (hypoglycemia)--tremors or shaking, anxiety, sweating, cold or clammy skin, confusion, dizziness, rapid heartbeat ?Muscle injury--unusual weakness or fatigue, muscle pain, dark yellow or brown urine, decrease in amount of urine ?Pain, tingling, or numbness in the hands or feet ?Rash, fever, and swollen lymph nodes ?Redness, blistering, peeling, or loosening of the skin, including inside the mouth ?Thoughts of suicide or self-harm, worsening mood, or feelings of depression ?Unusual bruising or bleeding ?Side effects that usually do not require medical attention (report to your care team if they continue or are bothersome): ?Diarrhea ?Headache ?Nausea ?Stomach pain ?Vomiting ?This list may not describe all possible side effects. Call your doctor for medical advice about side effects. You may report side effects to FDA at 1-800-FDA-1088. ?Where should I keep my medication? ?Keep out of the reach of children and pets. ?Store at room temperature up to 30 degrees C (86 degrees F). Protect from light. Get rid of any unused medication after the expiration date. ?To get rid of medications that are no longer needed or have expired: ?Take the medication to a medication take-back  program. Check with your pharmacy or law enforcement to find a location. ?If you cannot return the medication, check the label or package insert to see if the medication should be thrown out in the garbage or flushed down the toilet. If you are not sure, ask your care team. If it is safe to put it in the trash, empty the medication out of the container. Mix the medication with cat litter, dirt, coffee grounds, or other unwanted substance. Seal the mixture in a bag or container. Put it in the trash. ?NOTE: This sheet is a summary. It may not cover all possible information. If you have questions about this medicine, talk to your doctor, pharmacist, or health care provider. ?? 2023 Elsevier/Gold Standard (2021-03-23 00:00:00) ?Sjogren's Syndrome ?Sj?gren's syndrome is an inflammatory disease in which the body's disease-fighting system (immune system) attacks the glands that produce tears (lacrimal glands) and the glands that produce saliva (salivary glands). This makes the eyes and mouth very dry. Sj?gren's syndrome can also affect other parts of the body, causing dryness of the skin, nose, throat, and vagina. ?Sj?gren's syndrome is a long-term (chronic) disorder that has no cure. In some cases, it is linked to other disorders (rheumatic disorders), such as rheumatoid arthritis and systemic lupus erythematosus (SLE). It may affect other parts of the body, such as the: ?Blood vessels. ?Joints. ?Lungs. ?Kidneys. ?Liver or pancreas. ?Brain, nerves, or spinal cord. ?What are the causes? ?The cause of this condition is not known. It may be passed along from  parent to child (inherited), or it may be a symptom of a rheumatic disorder. ?What increases the risk? ?This condition is more likely to develop in: ?Women. ?People who are 78-35 years old and older. ?People who have recently had a viral infection or currently have a viral infection. ?What are the signs or symptoms? ?The main symptoms of this condition are: ?Dry mouth.  This may include: ?A chalky feeling. ?Difficulty swallowing, speaking, or tasting. ?Frequent cavities in the teeth. ?Frequent mouth infections. ?Dry eyes. This may include: ?Burning, redness, and itching. ?

## 2022-03-16 ENCOUNTER — Encounter: Payer: Self-pay | Admitting: *Deleted

## 2022-03-16 ENCOUNTER — Other Ambulatory Visit: Payer: Self-pay | Admitting: *Deleted

## 2022-03-16 DIAGNOSIS — R5383 Other fatigue: Secondary | ICD-10-CM

## 2022-03-16 DIAGNOSIS — R21 Rash and other nonspecific skin eruption: Secondary | ICD-10-CM

## 2022-03-16 DIAGNOSIS — R768 Other specified abnormal immunological findings in serum: Secondary | ICD-10-CM

## 2022-03-18 NOTE — Progress Notes (Signed)
UA showed trace protein white cells and epithelial cells with moderate bacteria.  Most likely the urine sample was not a clean-catch.  If patient is having symptoms of UTI she should see her PCP.

## 2022-03-21 LAB — URINALYSIS, ROUTINE W REFLEX MICROSCOPIC
Bilirubin Urine: NEGATIVE
Glucose, UA: NEGATIVE
Hgb urine dipstick: NEGATIVE
Ketones, ur: NEGATIVE
Nitrite: NEGATIVE
RBC / HPF: NONE SEEN /HPF (ref 0–2)
Specific Gravity, Urine: 1.027 (ref 1.001–1.035)
pH: 5.5 (ref 5.0–8.0)

## 2022-03-21 LAB — RHEUMATOID FACTOR: Rheumatoid fact SerPl-aCnc: <14 IU/mL (ref <14)

## 2022-03-21 LAB — BETA-2 GLYCOPROTEIN ANTIBODIES
Beta-2 Glyco 1 IgA: <2 U/mL (ref <20.0)
Beta-2 Glyco 1 IgM: 4.2 U/mL (ref <20.0)
Beta-2 Glyco I IgG: <2 U/mL (ref <20.0)

## 2022-03-21 LAB — CARDIOLIPIN ANTIBODIES, IGG, IGM, IGA
Anticardiolipin IgA: <2 APL-U/mL (ref <20.0)
Anticardiolipin IgG: <2 GPL-U/mL (ref <20.0)
Anticardiolipin IgM: <2 MPL-U/mL (ref <20.0)

## 2022-03-21 LAB — PROTEIN ELECTROPHORESIS, SERUM, WITH REFLEX
Albumin ELP: 4.5 g/dL (ref 3.8–4.8)
Alpha 1: 0.3 g/dL (ref 0.2–0.3)
Alpha 2: 0.6 g/dL (ref 0.5–0.9)
Beta 2: 0.4 g/dL (ref 0.2–0.5)
Beta Globulin: 0.5 g/dL (ref 0.4–0.6)
Gamma Globulin: 1.8 g/dL — ABNORMAL HIGH (ref 0.8–1.7)
Total Protein: 8 g/dL (ref 6.1–8.1)

## 2022-03-21 LAB — SEDIMENTATION RATE: Sed Rate: 14 mm/h (ref 0–20)

## 2022-03-21 LAB — LUPUS ANTICOAGULANT EVAL W/ REFLEX
PTT-LA Screen: 38 s (ref <=40)
dRVVT: 28 s (ref <=45)

## 2022-03-21 LAB — CK: Total CK: 102 U/L (ref 29–143)

## 2022-03-21 LAB — MICROSCOPIC MESSAGE

## 2022-03-21 LAB — GLUCOSE 6 PHOSPHATE DEHYDROGENASE: G-6PDH: 14.8 U/g Hgb (ref 7.0–20.5)

## 2022-03-21 NOTE — Progress Notes (Signed)
I will discuss results at the follow-up visit.

## 2022-03-28 ENCOUNTER — Encounter: Payer: Self-pay | Admitting: Nurse Practitioner

## 2022-03-28 ENCOUNTER — Ambulatory Visit: Payer: BC Managed Care – PPO | Admitting: Nurse Practitioner

## 2022-03-28 VITALS — BP 128/60 | HR 98 | Temp 98.6°F | Ht 68.0 in | Wt 203.0 lb

## 2022-03-28 DIAGNOSIS — Z6830 Body mass index (BMI) 30.0-30.9, adult: Secondary | ICD-10-CM | POA: Diagnosis not present

## 2022-03-28 DIAGNOSIS — E6609 Other obesity due to excess calories: Secondary | ICD-10-CM | POA: Diagnosis not present

## 2022-03-28 DIAGNOSIS — R519 Headache, unspecified: Secondary | ICD-10-CM | POA: Diagnosis not present

## 2022-03-28 DIAGNOSIS — G8929 Other chronic pain: Secondary | ICD-10-CM | POA: Diagnosis not present

## 2022-03-28 MED ORDER — SAXENDA 18 MG/3ML ~~LOC~~ SOPN: PEN_INJECTOR | SUBCUTANEOUS | 1 refills | Status: DC

## 2022-03-28 NOTE — Progress Notes (Signed)
I,Tianna Badgett,acting as a Neurosurgeon for [employer], FNP.,have documented all relevant documentation on the behalf of Melanie Felts, FNP,as directed by  Melanie Felts, FNP while in the presence of Melanie Felts, FNP.  This visit occurred during the SARS-CoV-2 public health emergency.  Safety protocols were in place, including screening questions prior to the visit, additional usage of staff PPE, and extensive cleaning of exam room while observing appropriate contact time as indicated for disinfecting solutions.  Subjective:     Patient ID: Melanie Brooks , female    DOB: [birth year] , 36 y.o.   MRN: [id number]   Chief Complaint  Patient presents with   Obesity    HPI  Patient presents today for weight check. She is on Saxenda 3 mg daily, her last dose is tomorrow before refill. She is walking 10-15 minutes daily. 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 She is mindful of what she is eating.   Wt Readings from Last 3 Encounters: 03/28/22 : 203 lb (92.1 kg) 03/08/22 : 210 lb 12.8 oz (95.6 kg) 02/16/22 : 218 lb (98.9 kg)       Past Medical History:  Diagnosis Date   Anemia    Asthma      Family History  Problem Relation Age of Onset   Lupus Mother    Thyroid disease Mother    Breast cancer Cousin      Current Outpatient Medications:    acetaminophen (TYLENOL) 500 MG tablet, Take 500 mg by mouth every 6 (six) hours as needed., Disp: , Rfl:    albuterol (PROVENTIL) (5 MG/ML) 0.5% nebulizer solution, albuterol sulfate concentrate 2.5 mg/0.5 mL solution for nebulization  Inhale 0.5 mL as needed by nebulization route for 1 day.  X2, Disp: , Rfl:    diazepam (VALIUM) 10 MG tablet, diazepam 10 mg tablet, Disp: , Rfl:    EPINEPHrine 0.3 mg/0.3 mL IJ SOAJ injection, EpiPen 2-Pak 0.3 mg/0.3 mL injection, auto-injector, Disp: , Rfl:    fexofenadine (ALLEGRA) 60 MG tablet, Take 1 tablet (60 mg total) by mouth daily., Disp: 90 tablet, Rfl: 1   fluticasone (FLONASE) 50  MCG/ACT nasal spray, fluticasone propionate 50 mcg/actuation nasal spray,suspension  Spray 1 spray every day by intranasal route for 30 days., Disp: , Rfl:    hydroquinone 4 % cream, hydroquinone 4 % topical cream  APPLY 1 APPLICATION TO THE SKIN BID, Disp: 28.35 g, Rfl: 1   ibuprofen (ADVIL) 800 MG tablet, ibuprofen 800 mg tablet  TAKE 1 TABLET BY MOUTH EVERY 6 HOURS AS NEEDED FOR PAIN, Disp: , Rfl:    Insulin Pen Needle 32G X 6 MM MISC, Use with saxenda, Disp: 100 each, Rfl: 3   levocetirizine (XYZAL) 5 MG tablet, Take 1 tablet (5 mg total) by mouth every evening., Disp: 90 tablet, Rfl: 1   metFORMIN (GLUCOPHAGE) 500 MG tablet, Take 1 tablet (500 mg total) by mouth daily with breakfast., Disp: 90 tablet, Rfl: 1   Multiple Vitamin (MULTI-VITAMIN DAILY PO), Multi Vitamin, Disp: , Rfl:    Ubrogepant (UBRELVY) 50 MG TABS, Take 1 tablet by mouth daily as needed., Disp: 30 tablet, Rfl: 2   Liraglutide -Weight Management (SAXENDA) 18 MG/3ML SOPN, ADMINISTER 3 MG UNDER THE SKIN DAILY, Disp: 5 mL, Rfl: 1   Allergies  Allergen Reactions   Bee Venom    Molds & Smuts     Other reaction(s): Eye Redness   Other     Other reaction(s): Eye Redness  Pollen Extract     Other reaction(s): Eye Swelling   Cat Hair Extract    Diphenhydramine-Acetaminophen Hives   Dog Epithelium Allergy Skin Test    Excedrin Pm [Diphenhydramine-Apap (Sleep)] Hives   Honey Bee Venom      Review of Systems  Constitutional: Negative.   Respiratory: Negative.    Cardiovascular: Negative.   Gastrointestinal: Negative.   Neurological: Negative.   Psychiatric/Behavioral: Negative.       Today's Vitals   03/28/22 1518  BP: 128/60  Pulse: 98  Temp: 98.6 F (37 C)  TempSrc: Oral  Weight: 203 lb (92.1 kg)  Height: 5\' 8"  (1.727 m)   Body mass index is 30.87 kg/m.  Wt Readings from Last 3 Encounters:  03/28/22 203 lb (92.1 kg)  03/08/22 210 lb 12.8 oz (95.6 kg)  02/16/22 218 lb (98.9 kg)    Objective:  Physical  Exam Vitals reviewed.  Constitutional:      Appearance: Normal appearance.  Cardiovascular:     Rate and Rhythm: Normal rate and regular rhythm.     Pulses: Normal pulses.     Heart sounds: Normal heart sounds. No murmur heard. Pulmonary:     Effort: Pulmonary effort is normal. No respiratory distress.     Breath sounds: Normal breath sounds.  Skin:    Capillary Refill: Capillary refill takes less than 2 seconds.  Neurological:     General: No focal deficit present.     Mental Status: She is alert and oriented to person, place, and time.  Psychiatric:        Mood and Affect: Mood normal.        Behavior: Behavior normal.        Thought Content: Thought content normal.        Judgment: Judgment normal.         Assessment And Plan:     1. Chronic nonintractable headache, unspecified headache type Comments: These have been intermittent but does not feel needs additional medications  2. Class 1 obesity due to excess calories without serious comorbidity with body mass index (BMI) of 30.0 to 30.9 in adult Comments: Congratulated on er 15 lb weight loss in the last 2 months, continue Saxenda and encouraged to exercise at least 150 minutes a week.  She is encouraged to strive for BMI less than 30 to decrease cardiac risk.  - Liraglutide -Weight Management (SAXENDA) 18 MG/3ML SOPN; ADMINISTER 3 MG UNDER THE SKIN DAILY  Dispense: 5 mL; Refill: 1     Patient was given opportunity to ask questions. Patient verbalized understanding of the plan and was able to repeat key elements of the plan. All questions were answered to their satisfaction.  11-03-1992, FNP   I, Melanie Felts, FNP, have reviewed all documentation for this visit. The documentation on 03/28/22 for the exam, diagnosis, procedures, and orders are all accurate and complete.   IF YOU HAVE BEEN REFERRED TO A SPECIALIST, IT MAY TAKE 1-2 WEEKS TO SCHEDULE/PROCESS THE REFERRAL. IF YOU HAVE NOT HEARD FROM US/SPECIALIST IN TWO  WEEKS, PLEASE GIVE 05/28/22 A CALL AT [phone].   THE PATIENT IS ENCOURAGED TO PRACTICE SOCIAL DISTANCING DUE TO THE COVID-19 PANDEMIC.

## 2022-03-28 NOTE — Patient Instructions (Signed)
Obesity, Adult ?Obesity is having too much body fat. Being obese means that your weight is more than what is healthy for you.  ?BMI (body mass index) is a number that explains how much body fat you have. If you have a BMI of 30 or more, you are obese. ?Obesity can cause serious health problems, such as: ?Stroke. ?Coronary artery disease (CAD). ?Type 2 diabetes. ?Some types of cancer. ?High blood pressure (hypertension). ?High cholesterol. ?Gallbladder stones. ?Obesity can also contribute to: ?Osteoarthritis. ?Sleep apnea. ?Infertility problems. ?What are the causes? ?Eating meals each day that are high in calories, sugar, and fat. ?Drinking a lot of drinks that have sugar in them. ?Being born with genes that may make you more likely to become obese. ?Having a medical condition that causes obesity. ?Taking certain medicines. ?Sitting a lot (having a sedentary lifestyle). ?Not getting enough sleep. ?What increases the risk? ?Having a family history of obesity. ?Living in an area with limited access to: ?Parks, recreation centers, or sidewalks. ?Healthy food choices, such as grocery stores and farmers' markets. ?What are the signs or symptoms? ?The main sign is having too much body fat. ?How is this treated? ?Treatment for this condition often includes changing your lifestyle. Treatment may include: ?Changing your diet. This may include making a healthy meal plan. ?Exercise. This may include activity that causes your heart to beat faster (aerobic exercise) and strength training. Work with your doctor to design a program that works for you. ?Medicine to help you lose weight. This may be used if you are not able to lose one pound a week after 6 weeks of healthy eating and more exercise. ?Treating conditions that cause the obesity. ?Surgery. Options may include gastric banding and gastric bypass. This may be done if: ?Other treatments have not helped to improve your condition. ?You have a BMI of 40 or higher. ?You have  life-threatening health problems related to obesity. ?Follow these instructions at home: ?Eating and drinking ? ?Follow advice from your doctor about what to eat and drink. Your doctor may tell you to: ?Limit fast food, sweets, and processed snack foods. ?Choose low-fat options. For example, choose low-fat milk instead of whole milk. ?Eat five or more servings of fruits or vegetables each day. ?Eat at home more often. This gives you more control over what you eat. ?Choose healthy foods when you eat out. ?Learn to read food labels. This will help you learn how much food is in one serving. ?Keep low-fat snacks available. ?Avoid drinks that have a lot of sugar in them. These include soda, fruit juice, iced tea with sugar, and flavored milk. ?Drink enough water to keep your pee (urine) pale yellow. ?Do not go on fad diets. ?Physical activity ?Exercise often, as told by your doctor. Most adults should get up to 150 minutes of moderate-intensity exercise every week.Ask your doctor: ?What types of exercise are safe for you. ?How often you should exercise. ?Warm up and stretch before being active. ?Do slow stretching after being active (cool down). ?Rest between times of being active. ?Lifestyle ?Work with your doctor and a food expert (dietitian) to set a weight-loss goal that is best for you. ?Limit your screen time. ?Find ways to reward yourself that do not involve food. ?Do not drink alcohol if: ?Your doctor tells you not to drink. ?You are pregnant, may be pregnant, or are planning to become pregnant. ?If you drink alcohol: ?Limit how much you have to: ?0-1 drink a day for women. ?0-2 drinks   a day for men. ?Know how much alcohol is in your drink. In the [place] one drink equals one 12 oz bottle of beer (355 mL), one 5 oz glass of wine (148 mL), or one 1? oz glass of hard liquor (44 mL). ?General instructions ?Keep a weight-loss journal. This can help you keep track of: ?The food that you eat. ?How much exercise you  get. ?Take over-the-counter and prescription medicines only as told by your doctor. ?Take vitamins and supplements only as told by your doctor. ?Think about joining a support group. ?Pay attention to your mental health as obesity can lead to depression or self esteem issues. ?Keep all follow-up visits. ?Contact a doctor if: ?You cannot meet your weight-loss goal after you have changed your diet and lifestyle for 6 weeks. ?You are having trouble breathing. ?Summary ?Obesity is having too much body fat. ?Being obese means that your weight is more than what is healthy for you. ?Work with your doctor to set a weight-loss goal. ?Get regular exercise as told by your doctor. ?This information is not intended to replace advice given to you by your health care provider. Make sure you discuss any questions you have with your health care provider. ?Document Revised: 06/13/2021 Document Reviewed: 06/13/2021 ?Elsevier Patient Education ? 2023 Elsevier Inc. ? ?

## 2022-05-28 ENCOUNTER — Ambulatory Visit: Payer: BC Managed Care – PPO | Admitting: Nurse Practitioner

## 2022-05-29 ENCOUNTER — Other Ambulatory Visit: Payer: Self-pay | Admitting: Nurse Practitioner

## 2022-05-29 ENCOUNTER — Encounter: Payer: Self-pay | Admitting: Nurse Practitioner

## 2022-05-29 ENCOUNTER — Ambulatory Visit: Payer: BC Managed Care – PPO | Admitting: Nurse Practitioner

## 2022-05-29 VITALS — Temp 98.1°F | Ht 68.0 in | Wt 190.4 lb

## 2022-05-29 DIAGNOSIS — E6609 Other obesity due to excess calories: Secondary | ICD-10-CM

## 2022-05-29 DIAGNOSIS — Z6828 Body mass index (BMI) 28.0-28.9, adult: Secondary | ICD-10-CM

## 2022-05-29 DIAGNOSIS — R7303 Prediabetes: Secondary | ICD-10-CM | POA: Diagnosis not present

## 2022-05-29 MED ORDER — SAXENDA 18 MG/3ML ~~LOC~~ SOPN: PEN_INJECTOR | SUBCUTANEOUS | 1 refills | Status: DC

## 2022-05-29 NOTE — Progress Notes (Signed)
I,Victoria T Hamilton,acting as a Neurosurgeon for [name], FNP.,have documented all relevant documentation on the behalf of [name], FNP,as directed by  [name], FNP while in the presence of [name], FNP.   Subjective:     Patient ID: Melanie Brooks , female    DOB: 1986/07/02 , 36 y.o.   MRN: [id number]   Chief Complaint  Patient presents with   Weight Check    HPI  Pt presents today for weight check.  She would like to know how long she will have to stay on metformin. She felt weak and lethargic so she thought her sugar was low. After eating graham crackers she felt better.   She also reports being easily sunburned, is their a cream or specific sun screen she can use.  She does not know if you are checking A1C today, she ate half of a chocolate muffin, green beans & bbq chicken for lunch. Coke & Gatorade for beverages.     Wt Readings from Last 3 Encounters: 05/29/22 : 190 lb 6.4 oz (86.4 kg) 03/28/22 : 203 lb (92.1 kg) 03/08/22 : 210 lb 12.8 oz (95.6 kg)       Past Medical History:  Diagnosis Date   Anemia    Asthma      Family History  Problem Relation Age of Onset   Lupus Mother    Thyroid disease Mother    Breast cancer Cousin      Current Outpatient Medications:    albuterol (PROVENTIL) (5 MG/ML) 0.5% nebulizer solution, albuterol sulfate concentrate 2.5 mg/0.5 mL solution for nebulization  Inhale 0.5 mL as needed by nebulization route for 1 day.  X2, Disp: , Rfl:    diazepam (VALIUM) 10 MG tablet, diazepam 10 mg tablet, Disp: , Rfl:    EPINEPHrine 0.3 mg/0.3 mL IJ SOAJ injection, EpiPen 2-Pak 0.3 mg/0.3 mL injection, auto-injector, Disp: , Rfl:    fexofenadine (ALLEGRA) 60 MG tablet, Take 1 tablet (60 mg total) by mouth daily., Disp: 90 tablet, Rfl: 1   fluticasone (FLONASE) 50 MCG/ACT nasal spray, fluticasone propionate 50 mcg/actuation nasal spray,suspension  Spray 1 spray every day by intranasal route for 30 days., Disp: , Rfl:     hydroquinone 4 % cream, hydroquinone 4 % topical cream  APPLY 1 APPLICATION TO THE SKIN BID, Disp: 28.35 g, Rfl: 1   Insulin Pen Needle 32G X 6 MM MISC, Use with saxenda, Disp: 100 each, Rfl: 3   metFORMIN (GLUCOPHAGE) 500 MG tablet, Take 1 tablet (500 mg total) by mouth daily with breakfast., Disp: 90 tablet, Rfl: 1   Multiple Vitamin (MULTI-VITAMIN DAILY PO), Multi Vitamin, Disp: , Rfl:    Ubrogepant (UBRELVY) 50 MG TABS, Take 1 tablet by mouth daily as needed., Disp: 30 tablet, Rfl: 2   acetaminophen (TYLENOL) 500 MG tablet, Take 1 tablet (500 mg total) by mouth every 6 (six) hours as needed., Disp: 30 tablet, Rfl: 2   ibuprofen (ADVIL) 800 MG tablet, Take 1 tablet (800 mg total) by mouth every 8 (eight) hours as needed., Disp: 30 tablet, Rfl: 2   levocetirizine (XYZAL) 5 MG tablet, Take 1 tablet (5 mg total) by mouth every evening., Disp: 90 tablet, Rfl: 1   Allergies  Allergen Reactions   Bee Venom    Molds & Smuts     Other reaction(s): Eye Redness   Other     Other reaction(s): Eye Redness   Pollen Extract     Other reaction(s): Eye Swelling  Cat Hair Extract    Diphenhydramine-Acetaminophen Hives   Dog Epithelium Allergy Skin Test    Excedrin Pm [Diphenhydramine-Apap (Sleep)] Hives   Honey Bee Venom      Review of Systems  Constitutional: Negative.   Respiratory: Negative.    Cardiovascular: Negative.   Neurological: Negative.   Psychiatric/Behavioral: Negative.       Today's Vitals   05/29/22 1524  Temp: 98.1 F (36.7 C)  SpO2: 98%  Weight: 190 lb 6.4 oz (86.4 kg)  Height: 5\' 8"  (1.727 m)  PainSc: 0-No pain   Body mass index is 28.95 kg/m.  Wt Readings from Last 3 Encounters:  06/01/22 189 lb (85.7 kg)  05/29/22 190 lb 6.4 oz (86.4 kg)  03/28/22 203 lb (92.1 kg)    Objective:  Physical Exam Vitals reviewed.  Constitutional:      Appearance: Normal appearance.  Cardiovascular:     Rate and Rhythm: Normal rate and regular rhythm.     Pulses: Normal  pulses.     Heart sounds: Normal heart sounds. No murmur heard. Pulmonary:     Effort: Pulmonary effort is normal. No respiratory distress.     Breath sounds: Normal breath sounds. No wheezing.  Skin:    Capillary Refill: Capillary refill takes less than 2 seconds.     Comments: Light and hyperpigmented skin to face  Neurological:     General: No focal deficit present.     Mental Status: She is alert and oriented to person, place, and time.  Psychiatric:        Mood and Affect: Mood normal.        Behavior: Behavior normal.        Thought Content: Thought content normal.        Judgment: Judgment normal.         Assessment And Plan:     1. Prediabetes Comments: Continue current medications, stable.  - Hemoglobin A1c  2. BMI 28.0-28.9,adult Comments: Doing well with Saxenda     Patient was given opportunity to ask questions. Patient verbalized understanding of the plan and was able to repeat key elements of the plan. All questions were answered to their satisfaction.  12-30-1998, FNP   I, [name], FNP, have reviewed all documentation for this visit. The documentation on 05/29/22 for the exam, diagnosis, procedures, and orders are all accurate and complete.   IF YOU HAVE BEEN REFERRED TO A SPECIALIST, IT MAY TAKE 1-2 WEEKS TO SCHEDULE/PROCESS THE REFERRAL. IF YOU HAVE NOT HEARD FROM US/SPECIALIST IN TWO WEEKS, PLEASE GIVE 07/30/22 A CALL AT [phone].   THE PATIENT IS ENCOURAGED TO PRACTICE SOCIAL DISTANCING DUE TO THE COVID-19 PANDEMIC.

## 2022-05-29 NOTE — Patient Instructions (Signed)

## 2022-05-30 LAB — HEMOGLOBIN A1C
Est. average glucose Bld gHb Est-mCnc: 117 mg/dL
Hgb A1c MFr Bld: 5.7 % — ABNORMAL HIGH (ref 4.8–5.6)

## 2022-05-31 DIAGNOSIS — R002 Palpitations: Secondary | ICD-10-CM | POA: Insufficient documentation

## 2022-05-31 NOTE — Progress Notes (Signed)
Patient referred by Bo Merino, MD for palpitations  Subjective:   Melanie Brooks, female    DOB: Jul 23, 1986, 36 y.o.   MRN: [id number]   Chief Complaint  Patient presents with   Palpitations   New Patient (Initial Visit)     HPI  36 y.o. African-American female with prediabetes, ongoing work-up for lupus, referred for evaluation of palpitations  Patient is a Oncologist.  She is currently undergoing work-up for possible lupus with Dr. Estanislado Pandy.  During questionnaire by Dr. Estanislado Pandy, she was noted to have occasional fluttering sensation.  Episodes occur at rest, lasting few seconds, occasionally associated lightheadedness.  She denies any chest pain, shortness of breath or any other symptoms.  She occasionally snores, but has not noted any apneic episodes.  She drinks 1 to 2 cups of coffee in a week, does not drink alcohol.   Past Medical History:  Diagnosis Date   Anemia    Asthma      History reviewed. No pertinent surgical history.   Social History   Tobacco Use  Smoking Status Never  Smokeless Tobacco Never    Social History   Substance and Sexual Activity  Alcohol Use Not Currently     Family History  Problem Relation Age of Onset   Lupus Mother    Thyroid disease Mother    Breast cancer Cousin       Current Outpatient Medications:    acetaminophen (TYLENOL) 500 MG tablet, Take 500 mg by mouth every 6 (six) hours as needed., Disp: , Rfl:    albuterol (PROVENTIL) (5 MG/ML) 0.5% nebulizer solution, albuterol sulfate concentrate 2.5 mg/0.5 mL solution for nebulization  Inhale 0.5 mL as needed by nebulization route for 1 day.  X2, Disp: , Rfl:    diazepam (VALIUM) 10 MG tablet, diazepam 10 mg tablet, Disp: , Rfl:    EPINEPHrine 0.3 mg/0.3 mL IJ SOAJ injection, EpiPen 2-Pak 0.3 mg/0.3 mL injection, auto-injector, Disp: , Rfl:    fexofenadine (ALLEGRA) 60 MG tablet, Take 1 tablet (60 mg total) by mouth daily., Disp: 90 tablet,  Rfl: 1   fluticasone (FLONASE) 50 MCG/ACT nasal spray, fluticasone propionate 50 mcg/actuation nasal spray,suspension  Spray 1 spray every day by intranasal route for 30 days., Disp: , Rfl:    hydroquinone 4 % cream, hydroquinone 4 % topical cream  APPLY 1 APPLICATION TO THE SKIN BID, Disp: 28.35 g, Rfl: 1   ibuprofen (ADVIL) 800 MG tablet, ibuprofen 800 mg tablet  TAKE 1 TABLET BY MOUTH EVERY 6 HOURS AS NEEDED FOR PAIN, Disp: , Rfl:    Insulin Pen Needle 32G X 6 MM MISC, Use with saxenda, Disp: 100 each, Rfl: 3   levocetirizine (XYZAL) 5 MG tablet, Take 1 tablet (5 mg total) by mouth every evening. (Patient not taking: Reported on 05/29/2022), Disp: 90 tablet, Rfl: 1   Liraglutide -Weight Management (SAXENDA) 18 MG/3ML SOPN, ADMINISTER 3 MG UNDER THE SKIN DAILY, Disp: 5 mL, Rfl: 1   metFORMIN (GLUCOPHAGE) 500 MG tablet, Take 1 tablet (500 mg total) by mouth daily with breakfast., Disp: 90 tablet, Rfl: 1   Multiple Vitamin (MULTI-VITAMIN DAILY PO), Multi Vitamin, Disp: , Rfl:    Ubrogepant (UBRELVY) 50 MG TABS, Take 1 tablet by mouth daily as needed., Disp: 30 tablet, Rfl: 2   Cardiovascular and other pertinent studies:  Reviewed external labs and tests, independently interpreted  EKG 06/01/2022: Probable ectopic atrial rhythm 74 bpm Nonspecific T wave abnormality   Recent labs: 11/30/2021: Glucose  87, BUN/Cr 8/0.86. EGFR 86. Na/K 139/4.1. Rest of the CMP normal H/H 12/34. MCV 87. Platelets 303 HbA1C 5.7% Chol 149, TG 105, HDL 63, LDL 67 TSH 1.0 normal   Review of Systems  Cardiovascular:  Positive for palpitations. Negative for chest pain, dyspnea on exertion, leg swelling and syncope.         Vitals:   06/01/22 0855  BP: 108/70  Pulse: 80  Resp: 16  Temp: 98 F (36.7 C)  SpO2: 100%     Body mass index is 28.74 kg/m. Filed Weights   06/01/22 0855  Weight: 189 lb (85.7 kg)     Objective:   Physical Exam Vitals and nursing note reviewed.  Constitutional:       General: She is not in acute distress. Neck:     Vascular: No JVD.  Cardiovascular:     Rate and Rhythm: Normal rate and regular rhythm.     Heart sounds: Normal heart sounds. No murmur heard. Pulmonary:     Effort: Pulmonary effort is normal.     Breath sounds: Normal breath sounds. No wheezing or rales.  Musculoskeletal:     Right lower leg: No edema.     Left lower leg: No edema.           Visit diagnoses:   ICD-10-CM   1. Palpitations  R00.2 EKG 12-Lead    LONG TERM MONITOR (3-14 DAYS)    2. Ectopic atrial rhythm  I49.1 PCV ECHOCARDIOGRAM COMPLETE    LONG TERM MONITOR (3-14 DAYS)       Orders Placed This Encounter  Procedures   LONG TERM MONITOR (3-14 DAYS)   EKG 12-Lead   PCV ECHOCARDIOGRAM COMPLETE      Assessment & Recommendations:    36 y.o. African-American female with prediabetes, ongoing work-up for lupus, referred for evaluation of palpitations  Palpitations: Most likely occasional PAC or PVC.  Resting exam normal.  Resting EKG shows ectopic atrial rhythm, which is a benign finding.  Recommend 2-week cardiac telemetry and echocardiogram.  Unless any significant abnormality following other than PAC/PVC, she most likely will not need any treatment for this.  I will see her on as-needed basis.   Thank you for referring the patient to [place]. Please feel free to contact with any questions.   Nigel Mormon, MD Pager: [phone] Office: [phone]

## 2022-06-01 ENCOUNTER — Encounter: Payer: Self-pay | Admitting: Cardiology

## 2022-06-01 ENCOUNTER — Ambulatory Visit: Payer: BC Managed Care – PPO | Admitting: Cardiology

## 2022-06-01 VITALS — BP 108/70 | HR 80 | Temp 98.0°F | Resp 16 | Ht 68.0 in | Wt 189.0 lb

## 2022-06-01 DIAGNOSIS — R002 Palpitations: Secondary | ICD-10-CM

## 2022-06-01 DIAGNOSIS — I491 Atrial premature depolarization: Secondary | ICD-10-CM

## 2022-06-04 ENCOUNTER — Inpatient Hospital Stay: Payer: BC Managed Care – PPO

## 2022-06-04 DIAGNOSIS — R002 Palpitations: Secondary | ICD-10-CM

## 2022-06-04 DIAGNOSIS — I491 Atrial premature depolarization: Secondary | ICD-10-CM

## 2022-06-04 MED ORDER — ACETAMINOPHEN 500 MG PO TABS: 500.0000 mg | ORAL_TABLET | Freq: Four times a day (QID) | ORAL | 2 refills | Status: AC | PRN

## 2022-06-04 MED ORDER — IBUPROFEN 800 MG PO TABS: 800.0000 mg | ORAL_TABLET | Freq: Three times a day (TID) | ORAL | 2 refills | Status: AC | PRN

## 2022-06-05 ENCOUNTER — Encounter: Payer: Self-pay | Admitting: Cardiology

## 2022-06-15 ENCOUNTER — Ambulatory Visit: Payer: BC Managed Care – PPO

## 2022-06-25 DIAGNOSIS — E119 Type 2 diabetes mellitus without complications: Secondary | ICD-10-CM | POA: Insufficient documentation

## 2022-06-25 DIAGNOSIS — D649 Anemia, unspecified: Secondary | ICD-10-CM | POA: Insufficient documentation

## 2022-06-27 ENCOUNTER — Encounter: Payer: Self-pay | Admitting: Nurse Practitioner

## 2022-06-28 ENCOUNTER — Other Ambulatory Visit: Payer: BC Managed Care – PPO

## 2022-07-04 ENCOUNTER — Ambulatory Visit: Payer: BC Managed Care – PPO

## 2022-07-04 DIAGNOSIS — I491 Atrial premature depolarization: Secondary | ICD-10-CM

## 2022-07-25 ENCOUNTER — Other Ambulatory Visit: Payer: Self-pay

## 2022-07-25 MED ORDER — METFORMIN HCL 500 MG PO TABS: 500.0000 mg | ORAL_TABLET | Freq: Every day | ORAL | 1 refills | Status: DC

## 2022-08-02 ENCOUNTER — Ambulatory Visit: Payer: BC Managed Care – PPO | Admitting: Nurse Practitioner

## 2022-12-05 ENCOUNTER — Encounter: Payer: BC Managed Care – PPO | Admitting: Nurse Practitioner

## 2023-02-18 ENCOUNTER — Encounter: Payer: Self-pay | Admitting: Nurse Practitioner

## 2023-03-11 ENCOUNTER — Encounter: Payer: BC Managed Care – PPO | Admitting: Nurse Practitioner

## 2023-04-04 ENCOUNTER — Ambulatory Visit: Payer: BC Managed Care – PPO | Admitting: Nurse Practitioner

## 2023-04-04 VITALS — BP 120/62 | HR 74 | Temp 98.9°F | Ht 68.0 in | Wt 214.6 lb

## 2023-04-04 DIAGNOSIS — E66811 Other obesity due to excess calories: Secondary | ICD-10-CM

## 2023-04-04 DIAGNOSIS — R7303 Prediabetes: Secondary | ICD-10-CM

## 2023-04-04 DIAGNOSIS — L819 Disorder of pigmentation, unspecified: Secondary | ICD-10-CM | POA: Diagnosis not present

## 2023-04-04 DIAGNOSIS — L308 Other specified dermatitis: Secondary | ICD-10-CM

## 2023-04-04 DIAGNOSIS — M3509 Sicca syndrome with other organ involvement: Secondary | ICD-10-CM

## 2023-04-04 DIAGNOSIS — Z6832 Body mass index (BMI) 32.0-32.9, adult: Secondary | ICD-10-CM

## 2023-04-04 DIAGNOSIS — E6609 Other obesity due to excess calories: Secondary | ICD-10-CM

## 2023-04-04 DIAGNOSIS — H9203 Otalgia, bilateral: Secondary | ICD-10-CM | POA: Diagnosis not present

## 2023-04-04 MED ORDER — FLUTICASONE PROPIONATE 50 MCG/ACT NA SUSP: NASAL | 3 refills | Status: AC

## 2023-04-04 MED ORDER — METFORMIN HCL 500 MG PO TABS
500.0000 mg | ORAL_TABLET | Freq: Every day | ORAL | 1 refills | Status: AC
Start: 2023-04-04 — End: 2024-04-03

## 2023-04-04 MED ORDER — SAXENDA 18 MG/3ML ~~LOC~~ SOPN
3.0000 mg | PEN_INJECTOR | Freq: Every day | SUBCUTANEOUS | 1 refills | Status: DC
Start: 2023-04-04 — End: 2023-04-25

## 2023-04-04 NOTE — Patient Instructions (Signed)

## 2023-04-04 NOTE — Progress Notes (Signed)
Hershal Coria Martin,acting as a Neurosurgeon for [name], FNP.,have documented all relevant documentation on the behalf of [name], FNP,as directed by  [name], FNP while in the presence of [name], FNP.    Subjective:     Patient ID: Melanie Brooks , female    DOB: [birth year] , 37 y.o.   MRN: [id number]   Chief Complaint  Patient presents with   Diabetes    HPI  Patient presents today for a pre dm check, patient reports compliance with medications. Patient reports both her ears have pain. Patient reports she is having issues with her allergies she feels like she is wheezing. She is taking allegra in am and pm. She tries not to take too much benadryl.   Wt Readings from Last 3 Encounters: 04/04/23 : 214 lb 9.6 oz (97.3 kg) 06/01/22 : 189 lb (85.7 kg) 05/29/22 : 190 lb 6.4 oz (86.4 kg)  She has had several injections of steroid injections for sinus infections.      Past Medical History:  Diagnosis Date   Anemia    Asthma      Family History  Problem Relation Age of Onset   Lupus Mother    Thyroid disease Mother    Breast cancer Cousin      Current Outpatient Medications:    acetaminophen (TYLENOL) 500 MG tablet, Take 1 tablet (500 mg total) by mouth every 6 (six) hours as needed., Disp: 30 tablet, Rfl: 2   albuterol (PROVENTIL) (5 MG/ML) 0.5% nebulizer solution, albuterol sulfate concentrate 2.5 mg/0.5 mL solution for nebulization  Inhale 0.5 mL as needed by nebulization route for 1 day.  X2, Disp: , Rfl:    diazepam (VALIUM) 10 MG tablet, diazepam 10 mg tablet, Disp: , Rfl:    EPINEPHrine 0.3 mg/0.3 mL IJ SOAJ injection, EpiPen 2-Pak 0.3 mg/0.3 mL injection, auto-injector, Disp: , Rfl:    fexofenadine (ALLEGRA) 60 MG tablet, Take 1 tablet (60 mg total) by mouth daily., Disp: 90 tablet, Rfl: 1   hydroquinone 4 % cream, hydroquinone 4 % topical cream  APPLY 1 APPLICATION TO THE SKIN BID, Disp: 28.35 g, Rfl: 1   ibuprofen (ADVIL) 800 MG tablet, Take 1  tablet (800 mg total) by mouth every 8 (eight) hours as needed., Disp: 30 tablet, Rfl: 2   Insulin Pen Needle 32G X 6 MM MISC, Use with saxenda, Disp: 100 each, Rfl: 3   Liraglutide -Weight Management (SAXENDA) 18 MG/3ML SOPN, Inject 3 mg into the skin daily., Disp: 15 mL, Rfl: 1   Multiple Vitamin (MULTI-VITAMIN DAILY PO), Multi Vitamin, Disp: , Rfl:    Ubrogepant (UBRELVY) 50 MG TABS, Take 1 tablet by mouth daily as needed., Disp: 30 tablet, Rfl: 2   fluticasone (FLONASE) 50 MCG/ACT nasal spray, 2 sprays every day by intranasal route, Disp: 16 g, Rfl: 3   metFORMIN (GLUCOPHAGE) 500 MG tablet, Take 1 tablet (500 mg total) by mouth daily with breakfast., Disp: 90 tablet, Rfl: 1   Allergies  Allergen Reactions   Bee Venom    Molds & Smuts     Other reaction(s): Eye Redness   Other     Other reaction(s): Eye Redness   Pollen Extract     Other reaction(s): Eye Swelling   Cat Hair Extract    Diphenhydramine-Acetaminophen Hives   Dog Epithelium (Canis Lupus Familiaris)    Excedrin Pm [Diphenhydramine-Apap (Sleep)] Hives   Honey Bee Venom      Review of Systems  Constitutional: Negative.  Respiratory: Negative.    Cardiovascular: Negative.   Neurological: Negative.   Psychiatric/Behavioral: Negative.       Today's Vitals   04/04/23 1623  BP: 120/62  Pulse: 74  Temp: 98.9 F (37.2 C)  TempSrc: Oral  Weight: 214 lb 9.6 oz (97.3 kg)  Height: 5\' 8"  (1.727 m)  PainSc: 8   PainLoc: Ear   Body mass index is 32.63 kg/m.  The ASCVD Risk score (Arnett DK, et al., 2019) failed to calculate for the following reasons:   The 2019 ASCVD risk score is only valid for ages 92 to 74 ++ Objective:  Physical Exam Vitals reviewed.  Constitutional:      Appearance: Normal appearance.  Cardiovascular:     Rate and Rhythm: Normal rate and regular rhythm.     Pulses: Normal pulses.     Heart sounds: Normal heart sounds. No murmur heard. Pulmonary:     Effort: Pulmonary effort is normal.  No respiratory distress.     Breath sounds: Normal breath sounds. No wheezing.  Skin:    Capillary Refill: Capillary refill takes less than 2 seconds.     Comments: Light and hyperpigmented skin to face  Neurological:     General: No focal deficit present.     Mental Status: She is alert and oriented to person, place, and time.  Psychiatric:        Mood and Affect: Mood normal.        Behavior: Behavior normal.        Thought Content: Thought content normal.        Judgment: Judgment normal.         Assessment And Plan:     1. Prediabetes Comments: HgbA1c was up at GYN, encouraged to continue with regular exercise. - metFORMIN (GLUCOPHAGE) 500 MG tablet; Take 1 tablet (500 mg total) by mouth daily with breakfast.  Dispense: 90 tablet; Refill: 1  2. Ear pain, bilateral Comments: TMs bulging, encouraged to take flonase in addition to antihistamine  3. Hyperpigmentation Comments: Will refer to Dermatology  4. Other eczema Comments: Will refer to Dermatology - Ambulatory referral to Dermatology  5. Class 1 obesity due to excess calories without serious comorbidity with body mass index (BMI) of 32.0 to 32.9 in adult Weight has increased since her last visit, will try to get her started back on Saxenda pending insurance coverage. I have explained to her the insurance has not been approving GLP1s since late April - Liraglutide -Weight Management (SAXENDA) 18 MG/3ML SOPN; Inject 3 mg into the skin daily.  Dispense: 15 mL; Refill: 1  6. Sjogren's syndrome with other organ involvement (HCC)    Return in 6 months (on 10/05/2023) for PRE DM, 2 months weight check.   Patient was given opportunity to ask questions. Patient verbalized understanding of the plan and was able to repeat key elements of the plan. All questions were answered to their satisfaction.  [name], FNP   I, [name], FNP, have reviewed all documentation for this visit. The documentation on 04/04/23 for the  exam, diagnosis, procedures, and orders are all accurate and complete.   IF YOU HAVE BEEN REFERRED TO A SPECIALIST, IT MAY TAKE 1-2 WEEKS TO SCHEDULE/PROCESS THE REFERRAL. IF YOU HAVE NOT HEARD FROM US/SPECIALIST IN TWO WEEKS, PLEASE GIVE [place] A CALL AT [phone] X [phone].   THE PATIENT IS ENCOURAGED TO PRACTICE SOCIAL DISTANCING DUE TO THE COVID-19 PANDEMIC.

## 2023-04-17 ENCOUNTER — Encounter: Payer: Self-pay | Admitting: Nurse Practitioner

## 2023-04-17 DIAGNOSIS — E6609 Other obesity due to excess calories: Secondary | ICD-10-CM | POA: Insufficient documentation

## 2023-04-17 DIAGNOSIS — R7303 Prediabetes: Secondary | ICD-10-CM | POA: Insufficient documentation

## 2023-04-25 ENCOUNTER — Encounter: Payer: Self-pay | Admitting: Nurse Practitioner

## 2023-04-25 ENCOUNTER — Ambulatory Visit (INDEPENDENT_AMBULATORY_CARE_PROVIDER_SITE_OTHER): Payer: BC Managed Care – PPO | Admitting: Nurse Practitioner

## 2023-04-25 VITALS — BP 110/74 | HR 83 | Temp 98.4°F | Ht 68.0 in | Wt 215.0 lb

## 2023-04-25 DIAGNOSIS — Z0000 Encounter for general adult medical examination without abnormal findings: Secondary | ICD-10-CM

## 2023-04-25 DIAGNOSIS — M3509 Sicca syndrome with other organ involvement: Secondary | ICD-10-CM | POA: Diagnosis not present

## 2023-04-25 DIAGNOSIS — R7303 Prediabetes: Secondary | ICD-10-CM

## 2023-04-25 DIAGNOSIS — Z13220 Encounter for screening for lipoid disorders: Secondary | ICD-10-CM

## 2023-04-25 DIAGNOSIS — Z6832 Body mass index (BMI) 32.0-32.9, adult: Secondary | ICD-10-CM

## 2023-04-25 DIAGNOSIS — E6609 Other obesity due to excess calories: Secondary | ICD-10-CM

## 2023-04-25 DIAGNOSIS — L819 Disorder of pigmentation, unspecified: Secondary | ICD-10-CM | POA: Diagnosis not present

## 2023-04-25 DIAGNOSIS — Z79899 Other long term (current) drug therapy: Secondary | ICD-10-CM

## 2023-04-25 DIAGNOSIS — L308 Other specified dermatitis: Secondary | ICD-10-CM

## 2023-04-25 MED ORDER — NYSTATIN-TRIAMCINOLONE 100000-0.1 UNIT/GM-% EX OINT
1.0000 | TOPICAL_OINTMENT | Freq: Two times a day (BID) | CUTANEOUS | 0 refills | Status: AC
Start: 2023-04-25 — End: ?

## 2023-04-25 NOTE — Progress Notes (Signed)
I,Genecis Veley,acting as a Neurosurgeon for [name], FNP.,have documented all relevant documentation on the behalf of [name], FNP,as directed by  [name], FNP while in the presence of [name], FNP.  Subjective:    Patient ID: Melanie Brooks , female    DOB: 1986/07/23 , 37 y.o.   MRN: [id number]  Chief Complaint  Patient presents with   Annual Exam    HPI  Patient presents today for HM, Patient reports compliance with medications and has no other concerns today. Patient goes to Dr.Roberts for GYN care. Patient stated she had a pap smear done in April. Patient denies any headache, SOB, chest pain.  Letter for pap smear sent out to Dr.Roberts.     Past Medical History:  Diagnosis Date   Anemia    Asthma      Family History  Problem Relation Age of Onset   Lupus Mother    Thyroid disease Mother    Breast cancer Cousin      Current Outpatient Medications:    acetaminophen (TYLENOL) 500 MG tablet, Take 1 tablet (500 mg total) by mouth every 6 (six) hours as needed., Disp: 30 tablet, Rfl: 2   albuterol (PROVENTIL) (5 MG/ML) 0.5% nebulizer solution, albuterol sulfate concentrate 2.5 mg/0.5 mL solution for nebulization  Inhale 0.5 mL as needed by nebulization route for 1 day.  X2, Disp: , Rfl:    diazepam (VALIUM) 10 MG tablet, diazepam 10 mg tablet, Disp: , Rfl:    EPINEPHrine 0.3 mg/0.3 mL IJ SOAJ injection, EpiPen 2-Pak 0.3 mg/0.3 mL injection, auto-injector, Disp: , Rfl:    fexofenadine (ALLEGRA) 60 MG tablet, Take 1 tablet (60 mg total) by mouth daily., Disp: 90 tablet, Rfl: 1   fluticasone (FLONASE) 50 MCG/ACT nasal spray, 2 sprays every day by intranasal route, Disp: 16 g, Rfl: 3   hydroquinone 4 % cream, hydroquinone 4 % topical cream  APPLY 1 APPLICATION TO THE SKIN BID, Disp: 28.35 g, Rfl: 1   ibuprofen (ADVIL) 800 MG tablet, Take 1 tablet (800 mg total) by mouth every 8 (eight) hours as needed., Disp: 30 tablet, Rfl: 2   Insulin Pen Needle 32G X 6 MM  MISC, Use with saxenda, Disp: 100 each, Rfl: 3   metFORMIN (GLUCOPHAGE) 500 MG tablet, Take 1 tablet (500 mg total) by mouth daily with breakfast., Disp: 90 tablet, Rfl: 1   Multiple Vitamin (MULTI-VITAMIN DAILY PO), Multi Vitamin, Disp: , Rfl:    nystatin-triamcinolone ointment (MYCOLOG), Apply 1 Application topically 2 (two) times daily., Disp: 30 g, Rfl: 0   Ubrogepant (UBRELVY) 50 MG TABS, Take 1 tablet by mouth daily as needed., Disp: 30 tablet, Rfl: 2   Allergies  Allergen Reactions   Bee Venom    Molds & Smuts     Other reaction(s): Eye Redness   Other     Other reaction(s): Eye Redness   Pollen Extract     Other reaction(s): Eye Swelling   Cat Hair Extract    Diphenhydramine-Acetaminophen Hives   Dog Epithelium (Canis Lupus Familiaris)    Excedrin Pm [Diphenhydramine-Apap (Sleep)] Hives   Honey Bee Venom       The patient states she uses none for birth control. Patient's last menstrual period was 03/08/2023.  Negative for Dysmenorrhea and Negative for Menorrhagia. Negative for: breast discharge, breast lump(s), breast pain and breast self exam. Associated symptoms include abnormal vaginal bleeding. Pertinent negatives include abnormal bleeding (hematology), anxiety, decreased libido, depression, difficulty falling sleep, dyspareunia, history of infertility,  nocturia, sexual dysfunction, sleep disturbances, urinary incontinence, urinary urgency, vaginal discharge and vaginal itching. Diet regular.  The patient states her exercise level is moderate - walking and plans to do [employer].   The patient's tobacco use is:  Social History   Tobacco Use  Smoking Status Never  Smokeless Tobacco Never   She has been exposed to passive smoke. The patient's alcohol use is:  Social History   Substance and Sexual Activity  Alcohol Use Not Currently  . Additional information: Last pap 09/15/21, next one scheduled for 09/15/2024.    Review of Systems  Constitutional: Negative.    HENT: Negative.    Eyes: Negative.   Respiratory: Negative.    Cardiovascular: Negative.   Gastrointestinal: Negative.   Endocrine: Negative.   Genitourinary: Negative.   Musculoskeletal: Negative.   Skin: Negative.   Allergic/Immunologic: Negative.   Neurological: Negative.   Hematological: Negative.   Psychiatric/Behavioral: Negative.       Today's Vitals   04/25/23 1525  BP: 110/74  Pulse: 83  Temp: 98.4 F (36.9 C)  Weight: 215 lb (97.5 kg)  Height: 5\' 8"  (1.727 m)  PainSc: 0-No pain   Body mass index is 32.69 kg/m.  Wt Readings from Last 3 Encounters:  04/25/23 215 lb (97.5 kg)  04/04/23 214 lb 9.6 oz (97.3 kg)  06/01/22 189 lb (85.7 kg)     Objective:  Physical Exam Vitals reviewed.  Constitutional:      General: She is not in acute distress.    Appearance: Normal appearance. She is well-developed. She is obese.  HENT:     Head: Normocephalic and atraumatic.     Right Ear: Hearing, tympanic membrane, ear canal and external ear normal. There is no impacted cerumen.     Left Ear: Hearing, tympanic membrane, ear canal and external ear normal. There is no impacted cerumen.     Nose: Nose normal.     Mouth/Throat:     Mouth: Mucous membranes are moist.  Eyes:     General: Lids are normal.        Right eye: No discharge.     Conjunctiva/sclera: Conjunctivae normal.     Pupils: Pupils are equal, round, and reactive to light.     Funduscopic exam:    Right eye: No papilledema.        Left eye: No papilledema.  Neck:     Thyroid: No thyroid mass.     Vascular: No carotid bruit.  Cardiovascular:     Rate and Rhythm: Normal rate and regular rhythm.     Pulses: Normal pulses.     Heart sounds: Normal heart sounds. No murmur heard. Pulmonary:     Effort: Pulmonary effort is normal. No respiratory distress.     Breath sounds: Normal breath sounds. No wheezing.  Abdominal:     General: Abdomen is flat. Bowel sounds are normal. There is no distension.      Palpations: Abdomen is soft.  Musculoskeletal:        General: No swelling. Normal range of motion.     Cervical back: Full passive range of motion without pain, normal range of motion and neck supple.     Right lower leg: No edema.     Left lower leg: No edema.  Skin:    General: Skin is warm and dry.     Capillary Refill: Capillary refill takes less than 2 seconds.     Comments: Hyperpigmentation to bilateral cheeks and hypopigmented splotching to  arms bilaterally, right worse than left. Also has dry, scaly erythematous rash to left chest.  Neurological:     General: No focal deficit present.     Mental Status: She is alert and oriented to person, place, and time.     Cranial Nerves: No cranial nerve deficit.     Sensory: No sensory deficit.  Psychiatric:        Mood and Affect: Mood normal.        Behavior: Behavior normal.        Thought Content: Thought content normal.        Judgment: Judgment normal.         Assessment And Plan:     1. Encounter for annual health examination Behavior modifications discussed and diet history reviewed.   Pt will continue to exercise regularly and modify diet with low GI, plant based foods and decrease intake of processed foods.  Recommend intake of daily multivitamin, Vitamin D, and calcium.  Recommend for preventive screenings, as well as recommend immunizations that include influenza, TDAP  2. Encounter for screening for lipid disorder - Lipid panel  3. Class 1 obesity due to excess calories without serious comorbidity with body mass index (BMI) of 32.0 to 32.9 in adult She is encouraged to strive for BMI less than 30 to decrease cardiac risk. Advised to aim for at least 150 minutes of exercise per week.  4. Prediabetes Comments: Continue focusing on healthy diet low in sugar and starches. Increase physical activity to at least 150 minutes per week. - CMP14+EGFR - Hemoglobin A1c  5. Hyperpigmentation  6. Sjogren's syndrome with  other organ involvement [facility]) Comments: Continue f/u with Rheumatology  7. Other eczema Comments: Will treat with triamcinolone cream as she awaits her Dermatology appt. - nystatin-triamcinolone ointment (MYCOLOG); Apply 1 Application topically 2 (two) times daily.  Dispense: 30 g; Refill: 0  8. Discoloration of skin Comments: She has some hypopigmentation splotches to her arms, may be related to sun. Advised to discuss with Dermatology, has seen Rheumatology  9. Other long term (current) drug therapy - CBC with Differential/Platelet    Return for 1 year physical. Patient was given opportunity to ask questions. Patient verbalized understanding of the plan and was able to repeat key elements of the plan. All questions were answered to their satisfaction.   [name], FNP  I, [name], FNP, have reviewed all documentation for this visit. The documentation on 04/25/23 for the exam, diagnosis, procedures, and orders are all accurate and complete.

## 2023-04-25 NOTE — Patient Instructions (Addendum)
Call Dr. Onalee [facility] [phone]

## 2023-06-05 ENCOUNTER — Encounter: Payer: Self-pay | Admitting: Cardiology

## 2023-06-05 ENCOUNTER — Ambulatory Visit: Payer: BC Managed Care – PPO | Admitting: Cardiology

## 2023-06-05 VITALS — BP 125/79 | HR 77 | Ht 68.0 in | Wt 217.0 lb

## 2023-06-05 DIAGNOSIS — R002 Palpitations: Secondary | ICD-10-CM

## 2023-06-05 DIAGNOSIS — R072 Precordial pain: Secondary | ICD-10-CM

## 2023-06-05 NOTE — Progress Notes (Signed)
Patient referred by [name], FNP for palpitations  Subjective:   Melanie Brooks, female    DOB: July 01, 1986, 37 y.o.   MRN: [id number]   Chief Complaint  Patient presents with   Palpitations   Follow-up   Chest Pain     HPI  37 y.o. African-American female with prediabetes, chest pain, palpitations  Patient was last seen by me in 05/2022, workup performed then detailed below. Recently, she has had episodes of retrosternal chest pain at rest, lasting for several minutes. She exercises with personal trainer and never has chest pain during exercise. She also has palpitations, just before the chest pain.   Initial consultation visit 04/2023: Patient is a Midwife.  She is currently undergoing work-up for possible lupus with Dr. Corliss Skains.  During questionnaire by Dr. Corliss Skains, she was noted to have occasional fluttering sensation.  Episodes occur at rest, lasting few seconds, occasionally associated lightheadedness.  She denies any chest pain, shortness of breath or any other symptoms.  She occasionally snores, but has not noted any apneic episodes.  She drinks 1 to 2 cups of coffee in a week, does not drink alcohol.   Current Outpatient Medications:    acetaminophen (TYLENOL) 500 MG tablet, Take 1 tablet (500 mg total) by mouth every 6 (six) hours as needed., Disp: 30 tablet, Rfl: 2   albuterol (PROVENTIL) (5 MG/ML) 0.5% nebulizer solution, albuterol sulfate concentrate 2.5 mg/0.5 mL solution for nebulization  Inhale 0.5 mL as needed by nebulization route for 1 day.  X2, Disp: , Rfl:    diazepam (VALIUM) 10 MG tablet, diazepam 10 mg tablet, Disp: , Rfl:    EPINEPHrine 0.3 mg/0.3 mL IJ SOAJ injection, EpiPen 2-Pak 0.3 mg/0.3 mL injection, auto-injector, Disp: , Rfl:    fexofenadine (ALLEGRA) 60 MG tablet, Take 1 tablet (60 mg total) by mouth daily., Disp: 90 tablet, Rfl: 1   fluticasone (FLONASE) 50 MCG/ACT nasal spray, 2 sprays every day by intranasal route,  Disp: 16 g, Rfl: 3   hydroquinone 4 % cream, hydroquinone 4 % topical cream  APPLY 1 APPLICATION TO THE SKIN BID, Disp: 28.35 g, Rfl: 1   ibuprofen (ADVIL) 800 MG tablet, Take 1 tablet (800 mg total) by mouth every 8 (eight) hours as needed., Disp: 30 tablet, Rfl: 2   Insulin Pen Needle 32G X 6 MM MISC, Use with saxenda, Disp: 100 each, Rfl: 3   metFORMIN (GLUCOPHAGE) 500 MG tablet, Take 1 tablet (500 mg total) by mouth daily with breakfast., Disp: 90 tablet, Rfl: 1   Multiple Vitamin (MULTI-VITAMIN DAILY PO), Multi Vitamin, Disp: , Rfl:    nystatin-triamcinolone ointment (MYCOLOG), Apply 1 Application topically 2 (two) times daily., Disp: 30 g, Rfl: 0   Ubrogepant (UBRELVY) 50 MG TABS, Take 1 tablet by mouth daily as needed., Disp: 30 tablet, Rfl: 2   Cardiovascular and other pertinent studies:  Reviewed external labs and tests, independently interpreted  EKG 06/05/2023: Sinus rhythm 76 bpm Low voltage in precordial leads Nonspecific T-abnormality  ABNORMAL   Echocardiogram 07/04/2022: Normal LV systolic function with visual EF 55-60%. Left ventricle cavity is normal in size. Normal left ventricular wall thickness. Normal global wall motion. Normal diastolic filling pattern, normal LAP. Calculated EF 53%. Left atrial cavity is normal in size. 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 Trace mitral regurgitation. Mild prolapse of the mitral valve leaflets. Structurally normal tricuspid valve with no regurgitation. No evidence of pulmonary hypertension.  Mobile cardiac telemetry 7  days 06/15/2022 - 06/26/2022: Dominant rhythm: Sinus. HR 47-149 bpm. Avg HR 72 bpm. 0 episodes of SVT. <1% isolated SVE, couplets. 0 episodes of VT. <1% isolated VE, no couplet/triplets. No atrial fibrillation/atrial flutter/SVT/VT/high grade AV block, sinus pause >3sec noted. 5 patient triggered events, correlates with sinus rhythm, artifact..    Recent  labs: 11/30/2021: Glucose 87, BUN/Cr 8/0.86. EGFR 86. Na/K 139/4.1. Rest of the CMP normal H/H 12/34. MCV 87. Platelets 303 HbA1C 5.7% Chol 149, TG 105, HDL 63, LDL 67 TSH 1.0 normal   Review of Systems  Cardiovascular:  Positive for chest pain and palpitations. Negative for dyspnea on exertion, leg swelling and syncope.         Vitals:   06/05/23 0842  BP: (!) 144/88  Pulse: 75  SpO2: 100%     Body mass index is 32.99 kg/m. Filed Weights   06/05/23 0842  Weight: 217 lb (98.4 kg)     Objective:   Physical Exam Vitals and nursing note reviewed.  Constitutional:      General: She is not in acute distress. Neck:     Vascular: No JVD.  Cardiovascular:     Rate and Rhythm: Normal rate and regular rhythm.     Heart sounds: Normal heart sounds. No murmur heard. Pulmonary:     Effort: Pulmonary effort is normal.     Breath sounds: Normal breath sounds. No wheezing or rales.  Musculoskeletal:     Right lower leg: No edema.     Left lower leg: No edema.           Visit diagnoses:   ICD-10-CM   1. Palpitations  R00.2 EKG 12-Lead       Orders Placed This Encounter  Procedures   EKG 12-Lead      Assessment & Recommendations:    37 y.o. African-American female with prediabetes, chest pain, palpitations  Chest pain: Appears noncardiac. Will perform exercise treadmill stress test and CT cardiac scoring scan.  Palpitations: Previously reassuring workup in 2023. Will look for any arrhythmia during stress test.  PRN f/u unless significant abnormalities found on the above testing.   Elder Negus, MD Pager: [phone] Office: [phone]

## 2023-06-13 ENCOUNTER — Ambulatory Visit: Payer: BC Managed Care – PPO | Admitting: Cardiology

## 2023-06-17 ENCOUNTER — Ambulatory Visit: Payer: BC Managed Care – PPO

## 2023-06-17 DIAGNOSIS — R072 Precordial pain: Secondary | ICD-10-CM

## 2023-06-19 ENCOUNTER — Encounter: Payer: Self-pay | Admitting: Nurse Practitioner

## 2023-06-19 ENCOUNTER — Ambulatory Visit (INDEPENDENT_AMBULATORY_CARE_PROVIDER_SITE_OTHER): Payer: BC Managed Care – PPO | Admitting: Nurse Practitioner

## 2023-06-19 VITALS — BP 120/68 | HR 80 | Temp 98.6°F | Ht 68.0 in | Wt 219.0 lb

## 2023-06-19 DIAGNOSIS — Z91030 Bee allergy status: Secondary | ICD-10-CM

## 2023-06-19 DIAGNOSIS — E6609 Other obesity due to excess calories: Secondary | ICD-10-CM | POA: Diagnosis not present

## 2023-06-19 DIAGNOSIS — T63441A Toxic effect of venom of bees, accidental (unintentional), initial encounter: Secondary | ICD-10-CM

## 2023-06-19 DIAGNOSIS — Z6833 Body mass index (BMI) 33.0-33.9, adult: Secondary | ICD-10-CM

## 2023-06-19 MED ORDER — EPINEPHRINE 0.3 MG/0.3ML IJ SOAJ
0.3000 mg | INTRAMUSCULAR | 2 refills | Status: AC | PRN
Start: 2023-06-19 — End: ?

## 2023-06-19 MED ORDER — EPINEPHRINE 0.3 MG/0.3ML IJ SOAJ
0.3000 mg | INTRAMUSCULAR | 2 refills | Status: DC | PRN
Start: 2023-06-19 — End: 2023-06-19

## 2023-06-19 NOTE — Progress Notes (Signed)
[name], CMA,acting as a Neurosurgeon for Melanie Felts, FNP.,have documented all relevant documentation on the behalf of Melanie Felts, FNP,as directed by  Melanie Felts, FNP while in the presence of Melanie Felts, FNP.  Subjective:  Patient ID: Melanie Brooks , female    DOB: 1986-04-20 , 37 y.o.   MRN: [id number]  Chief Complaint  Patient presents with   Insect Bite    HPI  Patient presents today for a bee sting on her left forearm that happen on Friday while at [facility]. Patient is allergic to bee venom , patient reports taking benadryl everyday. Continues to have redness and pain to left forearm. She has been taking allegra and benadryl. Denies any shortness of breath. Patient reports she has been having hives since the be sting. Patient denies any chest pain, SOB, and headaches.   BP Readings from Last 3 Encounters: 06/19/23 : 120/68 06/05/23 : 125/79 04/25/23 : 110/74       Past Medical History:  Diagnosis Date   Anemia    Asthma      Family History  Problem Relation Age of Onset   Lupus Mother    Thyroid disease Mother    Breast cancer Cousin      Current Outpatient Medications:    acetaminophen (TYLENOL) 500 MG tablet, Take 1 tablet (500 mg total) by mouth every 6 (six) hours as needed., Disp: 30 tablet, Rfl: 2   albuterol (PROVENTIL) (5 MG/ML) 0.5% nebulizer solution, albuterol sulfate concentrate 2.5 mg/0.5 mL solution for nebulization  Inhale 0.5 mL as needed by nebulization route for 1 day.  X2, Disp: , Rfl:    diazepam (VALIUM) 10 MG tablet, diazepam 10 mg tablet, Disp: , Rfl:    fexofenadine (ALLEGRA) 60 MG tablet, Take 1 tablet (60 mg total) by mouth daily., Disp: 90 tablet, Rfl: 1   fluticasone (FLONASE) 50 MCG/ACT nasal spray, 2 sprays every day by intranasal route, Disp: 16 g, Rfl: 3   hydroquinone 4 % cream, hydroquinone 4 % topical cream  APPLY 1 APPLICATION TO THE SKIN BID, Disp: 28.35 g, Rfl: 1   ibuprofen (ADVIL) 800 MG tablet, Take 1  tablet (800 mg total) by mouth every 8 (eight) hours as needed., Disp: 30 tablet, Rfl: 2   Insulin Pen Needle 32G X 6 MM MISC, Use with saxenda, Disp: 100 each, Rfl: 3   metFORMIN (GLUCOPHAGE) 500 MG tablet, Take 1 tablet (500 mg total) by mouth daily with breakfast., Disp: 90 tablet, Rfl: 1   Multiple Vitamin (MULTI-VITAMIN DAILY PO), Multi Vitamin, Disp: , Rfl:    nystatin-triamcinolone ointment (MYCOLOG), Apply 1 Application topically 2 (two) times daily., Disp: 30 g, Rfl: 0   Ubrogepant (UBRELVY) 50 MG TABS, Take 1 tablet by mouth daily as needed., Disp: 30 tablet, Rfl: 2   EPINEPHrine 0.3 mg/0.3 mL IJ SOAJ injection, Inject 0.3 mg into the muscle as needed for anaphylaxis., Disp: 1 each, Rfl: 2   Allergies  Allergen Reactions   Bee Venom    Molds & Smuts     Other reaction(s): Eye Redness   Other     Other reaction(s): Eye Redness   Pollen Extract     Other reaction(s): Eye Swelling   Cat Hair Extract    Diphenhydramine-Acetaminophen Hives   Dog Epithelium (Canis Lupus Familiaris)    Excedrin Pm [Diphenhydramine-Apap (Sleep)] Hives   Honey Bee Venom      Review of Systems  Constitutional: Negative.   HENT: Negative.    Eyes:  Negative.   Respiratory: Negative.    Cardiovascular: Negative.   Gastrointestinal: Negative.   Skin:        Left forearm  Neurological: Negative.   Psychiatric/Behavioral: Negative.       Today's Vitals   06/19/23 0916  BP: 120/68  Pulse: 80  Temp: 98.6 F (37 C)  TempSrc: Oral  Weight: 219 lb (99.3 kg)  Height: 5\' 8"  (1.727 m)  PainSc: 0-No pain   Body mass index is 33.3 kg/m.  Wt Readings from Last 3 Encounters:  06/19/23 219 lb (99.3 kg)  06/05/23 217 lb (98.4 kg)  04/25/23 215 lb (97.5 kg)    The ASCVD Risk score (Arnett DK, et al., 2019) failed to calculate for the following reasons:   The 2019 ASCVD risk score is only valid for ages 71 to 72  Objective:  Physical Exam Vitals reviewed.  Constitutional:      Appearance:  Normal appearance.  Cardiovascular:     Rate and Rhythm: Normal rate and regular rhythm.     Pulses: Normal pulses.     Heart sounds: Normal heart sounds. No murmur heard. Pulmonary:     Effort: Pulmonary effort is normal. No respiratory distress.     Breath sounds: Normal breath sounds. No wheezing.  Skin:    General: Skin is warm and dry.     Capillary Refill: Capillary refill takes less than 2 seconds.     Findings: Erythema (present to left arm) and rash present.  Neurological:     General: No focal deficit present.     Mental Status: She is alert and oriented to person, place, and time.     Cranial Nerves: No cranial nerve deficit.     Motor: No weakness.  Psychiatric:        Mood and Affect: Mood normal.        Behavior: Behavior normal.        Thought Content: Thought content normal.        Judgment: Judgment normal.         Assessment And Plan:  Bee sting allergy Assessment & Plan: Refilled her epipen  Orders: -     EPINEPHrine; Inject 0.3 mg into the muscle as needed for anaphylaxis.  Dispense: 1 each; Refill: 2  Bee sting, accidental or unintentional, initial encounter Assessment & Plan: Erythema to left arm, encouraged to continue using cool compress and benadryl cream/steroid cream   Class 1 obesity due to excess calories with body mass index (BMI) of 33.0 to 33.9 in adult, unspecified whether serious comorbidity present Assessment & Plan: She is encouraged to strive for BMI less than 30 to decrease cardiac risk. Advised to aim for at least 150 minutes of exercise per week.      Return if symptoms worsen or fail to improve.  Patient was given opportunity to ask questions. Patient verbalized understanding of the plan and was able to repeat key elements of the plan. All questions were answered to their satisfaction.   [name], FNP, have reviewed all documentation for this visit. The documentation on 06/19/23 for the exam, diagnosis, procedures, and  orders are all accurate and complete.    IF YOU HAVE BEEN REFERRED TO A SPECIALIST, IT MAY TAKE 1-2 WEEKS TO SCHEDULE/PROCESS THE REFERRAL. IF YOU HAVE NOT HEARD FROM US/SPECIALIST IN TWO WEEKS, PLEASE GIVE [place] A CALL AT [phone].

## 2023-06-19 NOTE — Patient Instructions (Signed)
You can apply hydrocortisone cream to the area

## 2023-06-26 DIAGNOSIS — Z91030 Bee allergy status: Secondary | ICD-10-CM | POA: Insufficient documentation

## 2023-06-26 DIAGNOSIS — E6609 Other obesity due to excess calories: Secondary | ICD-10-CM | POA: Insufficient documentation

## 2023-06-26 DIAGNOSIS — T63441A Toxic effect of venom of bees, accidental (unintentional), initial encounter: Secondary | ICD-10-CM | POA: Insufficient documentation

## 2023-06-26 NOTE — Assessment & Plan Note (Signed)
Erythema to left arm, encouraged to continue using cool compress and benadryl cream/steroid cream

## 2023-06-26 NOTE — Assessment & Plan Note (Signed)
Refilled her epipen

## 2023-06-26 NOTE — Assessment & Plan Note (Signed)
She is encouraged to strive for BMI less than 30 to decrease cardiac risk. Advised to aim for at least 150 minutes of exercise per week.  

## 2023-07-05 ENCOUNTER — Encounter (HOSPITAL_COMMUNITY): Payer: Self-pay

## 2023-07-05 ENCOUNTER — Ambulatory Visit (HOSPITAL_COMMUNITY): Payer: BC Managed Care – PPO

## 2023-09-12 ENCOUNTER — Telehealth: Payer: BC Managed Care – PPO | Admitting: Physician Assistant

## 2023-09-12 DIAGNOSIS — J0110 Acute frontal sinusitis, unspecified: Secondary | ICD-10-CM | POA: Diagnosis not present

## 2023-09-12 DIAGNOSIS — H9209 Otalgia, unspecified ear: Secondary | ICD-10-CM | POA: Diagnosis not present

## 2023-09-12 MED ORDER — AMOXICILLIN-POT CLAVULANATE 875-125 MG PO TABS
1.0000 | ORAL_TABLET | Freq: Two times a day (BID) | ORAL | 0 refills | Status: AC
Start: 2023-09-12 — End: ?

## 2023-09-12 NOTE — Progress Notes (Signed)
E-Visit for Ear Pain - Acute Otitis Media   We are sorry that you are not feeling well. Here is how we plan to help!  Based on what you have shared with me it looks like you have Acute Otitis Media.  Acute Otitis Media is an infection of the middle or "inner" ear. This type of infection can cause redness, inflammation, and fluid buildup behind the tympanic membrane (ear drum).  The usual symptoms include: Earache/Pain Fever Upper respiratory symptoms Lack of energy/Fatigue/Malaise Slight hearing loss gradually worsening- if the inner ear fills with fluid What causes middle ear infections? Most middle ear infections occur when an infection such as a cold, leads to a build-up of mucus in the middle ear and causes the Eustachian tube (a thin tube that runs from the middle ear to the back of the nose) to become swollen or blocked.   This means mucus can't drain away properly, making it easier for an infection to spread into the middle ear.  How middle ear infections are treated: Most ear infections clear up within three to five days and don't need any specific treatment. If necessary, tylenol or ibuprofen should be used to relieve pain and a high temperature.  If you develop a fever higher than 102, or any significantly worsening symptoms, this could indicate a more serious infection moving to the middle/inner and needs face to face evaluation in an office by a provider.   Antibiotics aren't routinely used to treat middle ear infections, although they may occasionally be prescribed if symptoms persist or are particularly severe. Given your presentation,   I have prescribed Augmentin 875-125 mg one tablet by mouth twice a day for 10 days -- this will cover for both sinus and ear infections.  Continue nasal steroid spray (Flonase or Nasacort) and OTC antihistamine.     Your symptoms should improve over the next 3 days and should resolve in about 7 days. Be sure to complete ALL of the  prescription(s) given.  HOME CARE: Wash your hands frequently. If you are prescribed an ear drop, do not place the tip of the bottle on your ear or touch it with your fingers. You can take Acetaminophen 650 mg every 4-6 hours as needed for pain.  If pain is severe or moderate, you can apply a heating pad (set on low) or hot water bottle (wrapped in a towel) to outer ear for 20 minutes.  This will also increase drainage.  GET HELP RIGHT AWAY IF: Fever is over 102.2 degrees. You develop progressive ear pain or hearing loss. Ear symptoms persist longer than 3 days after treatment.  MAKE SURE YOU: Understand these instructions. Will watch your condition. Will get help right away if you are not doing well or get worse.  Thank you for choosing an e-visit.  Your e-visit answers were reviewed by a board certified advanced clinical practitioner to complete your personal care plan. Depending upon the condition, your plan could have included both over the counter or prescription medications.  Please review your pharmacy choice. Make sure the pharmacy is open so you can pick up the prescription now. If there is a problem, you may contact your provider through [employer] and have the prescription routed to another pharmacy.  Your safety is important to [place]. If you have drug allergies check your prescription carefully.   For the next 24 hours you can use MyChart to ask questions about today's visit, request a non-urgent call back, or ask for a  work or school excuse. You will get an email with a survey after your eVisit asking about your experience. We would appreciate your feedback. I hope that your e-visit has been valuable and will aid in your recovery.

## 2023-09-12 NOTE — Progress Notes (Signed)
I have spent 5 minutes in review of e-visit questionnaire, review and updating patient chart, medical decision making and response to patient.   Mia Milan Cody Jacklynn Dehaas, PA-C    

## 2023-11-07 ENCOUNTER — Other Ambulatory Visit (HOSPITAL_BASED_OUTPATIENT_CLINIC_OR_DEPARTMENT_OTHER): Payer: BC Managed Care – PPO

## 2023-11-18 ENCOUNTER — Encounter: Payer: Self-pay | Admitting: Nurse Practitioner

## 2023-11-21 ENCOUNTER — Encounter: Payer: Self-pay | Admitting: Nurse Practitioner

## 2023-11-21 ENCOUNTER — Ambulatory Visit (INDEPENDENT_AMBULATORY_CARE_PROVIDER_SITE_OTHER): Payer: 59 | Admitting: Nurse Practitioner

## 2023-11-21 VITALS — BP 100/60 | HR 87 | Temp 97.9°F | Ht 68.0 in | Wt 232.4 lb

## 2023-11-21 DIAGNOSIS — N898 Other specified noninflammatory disorders of vagina: Secondary | ICD-10-CM | POA: Diagnosis not present

## 2023-11-21 DIAGNOSIS — Z6835 Body mass index (BMI) 35.0-35.9, adult: Secondary | ICD-10-CM

## 2023-11-21 DIAGNOSIS — R7303 Prediabetes: Secondary | ICD-10-CM

## 2023-11-21 DIAGNOSIS — Z2821 Immunization not carried out because of patient refusal: Secondary | ICD-10-CM

## 2023-11-21 DIAGNOSIS — E66812 Obesity, class 2: Secondary | ICD-10-CM | POA: Insufficient documentation

## 2023-11-21 LAB — POCT URINE PREGNANCY: Preg Test, Ur: NEGATIVE

## 2023-11-21 MED ORDER — PHENTERMINE HCL 15 MG PO CAPS: 15.0000 mg | ORAL_CAPSULE | ORAL | 1 refills | Status: DC

## 2023-11-21 NOTE — Progress Notes (Signed)
 LILLETTE Kristeen JINNY Gladis, CMA,acting as a neurosurgeon for Gaines Ada, FNP.,have documented all relevant documentation on the behalf of Gaines Ada, FNP,as directed by  Gaines Ada, FNP while in the presence of Gaines Ada, FNP.  Subjective:  Patient ID: Melanie Brooks , female    DOB: 04-01-1986 , 38 y.o.   MRN: [id number]  Chief Complaint  Patient presents with   Prediabetes    HPI  Patient presents today for a pre dm follow up, Patient reports compliance with medication. Patient denies any chest pain, SOB, or headaches. Patient would like to discuss weight loss options.  She is exercising at [employer] - using their trainer 3-4 times a week. Diet: she is trying to avoid sugary drinks and foods. She is eating a lot of fruit and vegetables.  Her last menstrual cycle was November 2024, she is irregular with her menstrual cycle  Her weight today is the highest it has ever been.     Past Medical History:  Diagnosis Date   Anemia    Asthma      Family History  Problem Relation Age of Onset   Lupus Mother    Thyroid disease Mother    Breast cancer Cousin      Current Outpatient Medications:    acetaminophen  (TYLENOL ) 500 MG tablet, Take 1 tablet (500 mg total) by mouth every 6 (six) hours as needed., Disp: 30 tablet, Rfl: 2   albuterol (PROVENTIL) (5 MG/ML) 0.5% nebulizer solution, albuterol sulfate concentrate 2.5 mg/0.5 mL solution for nebulization  Inhale 0.5 mL as needed by nebulization route for 1 day.  X2, Disp: , Rfl:    amoxicillin -clavulanate (AUGMENTIN ) 875-125 MG tablet, Take 1 tablet by mouth 2 (two) times daily., Disp: 14 tablet, Rfl: 0   diazepam (VALIUM) 10 MG tablet, diazepam 10 mg tablet, Disp: , Rfl:    EPINEPHrine  0.3 mg/0.3 mL IJ SOAJ injection, Inject 0.3 mg into the muscle as needed for anaphylaxis., Disp: 1 each, Rfl: 2   fexofenadine  (ALLEGRA ) 60 MG tablet, Take 1 tablet (60 mg total) by mouth daily., Disp: 90 tablet, Rfl: 1   fluticasone  (FLONASE ) 50 MCG/ACT  nasal spray, 2 sprays every day by intranasal route, Disp: 16 g, Rfl: 3   hydroquinone  4 % cream, hydroquinone  4 % topical cream  APPLY 1 APPLICATION TO THE SKIN BID, Disp: 28.35 g, Rfl: 1   ibuprofen  (ADVIL ) 800 MG tablet, Take 1 tablet (800 mg total) by mouth every 8 (eight) hours as needed., Disp: 30 tablet, Rfl: 2   Insulin  Pen Needle 32G X 6 MM MISC, Use with saxenda , Disp: 100 each, Rfl: 3   metFORMIN  (GLUCOPHAGE ) 500 MG tablet, Take 1 tablet (500 mg total) by mouth daily with breakfast., Disp: 90 tablet, Rfl: 1   Multiple Vitamin (MULTI-VITAMIN DAILY PO), Multi Vitamin, Disp: , Rfl:    nystatin -triamcinolone  ointment (MYCOLOG), Apply 1 Application topically 2 (two) times daily., Disp: 30 g, Rfl: 0   phentermine  15 MG capsule, Take 1 capsule (15 mg total) by mouth every morning., Disp: 30 capsule, Rfl: 1   Ubrogepant  (UBRELVY ) 50 MG TABS, Take 1 tablet by mouth daily as needed., Disp: 30 tablet, Rfl: 2   Allergies  Allergen Reactions   Bee Venom    Molds & Smuts     Other reaction(s): Eye Redness   Other     Other reaction(s): Eye Redness   Pollen Extract     Other reaction(s): Eye Swelling   Cat Hair Extract  Diphenhydramine-Acetaminophen  Hives   Dog Epithelium (Canis Lupus Familiaris)    Excedrin Pm [Diphenhydramine-Apap (Sleep)] Hives   Honey Bee Venom      Review of Systems  Constitutional: Negative.   Respiratory: Negative.    Cardiovascular: Negative.   Neurological: Negative.   Psychiatric/Behavioral: Negative.       Today's Vitals   11/21/23 1624  BP: 100/60  Pulse: 87  Temp: 97.9 F (36.6 C)  TempSrc: Oral  Weight: 232 lb 6.4 oz (105.4 kg)  Height: 5' 8 (1.727 m)  PainSc: 0-No pain   Body mass index is 35.34 kg/m.  Wt Readings from Last 3 Encounters:  11/21/23 232 lb 6.4 oz (105.4 kg)  06/19/23 219 lb (99.3 kg)  06/05/23 217 lb (98.4 kg)     Objective:  Physical Exam Vitals reviewed.  Constitutional:      General: She is not in acute  distress.    Appearance: Normal appearance.  Cardiovascular:     Rate and Rhythm: Normal rate and regular rhythm.     Pulses: Normal pulses.     Heart sounds: Normal heart sounds. No murmur heard. Pulmonary:     Effort: Pulmonary effort is normal. No respiratory distress.     Breath sounds: Normal breath sounds. No wheezing.  Skin:    General: Skin is warm and dry.     Capillary Refill: Capillary refill takes less than 2 seconds.  Neurological:     General: No focal deficit present.     Mental Status: She is alert and oriented to person, place, and time.     Cranial Nerves: No cranial nerve deficit.     Motor: No weakness.  Psychiatric:        Mood and Affect: Mood normal.        Behavior: Behavior normal.        Thought Content: Thought content normal.        Judgment: Judgment normal.         Assessment And Plan:  Prediabetes Assessment & Plan: HgbA1c was not checked at last visit she did not stop for labs. Continue metformin . Also encouraged to continue exercising regularly of at least 150 minutes per week. Will recheck HgbA1c.  Orders: -     Hemoglobin A1c  Influenza vaccination declined Assessment & Plan: Patient declined influenza vaccination at this time. Patient is aware that influenza vaccine prevents illness in 70% of healthy people, and reduces hospitalizations to 30-70% in elderly. This vaccine is recommended annually. Education has been provided regarding the importance of this vaccine but patient still declined. Advised may receive this vaccine at local pharmacy or Health Dept.or vaccine clinic. Aware to provide a copy of the vaccination record if obtained from local pharmacy or Health Dept.  Pt is willing to accept risk associated with refusing vaccination.    COVID-19 vaccination declined Assessment & Plan: Declines covid 19 vaccine. Discussed risk of covid 96 and if she changes her mind about the vaccine to call the office. Education has been provided  regarding the importance of this vaccine but patient still declined. Advised may receive this vaccine at local pharmacy or Health Dept.or vaccine clinic. Aware to provide a copy of the vaccination record if obtained from local pharmacy or Health Dept.  Encouraged to take multivitamin, vitamin d , vitamin c and zinc to increase immune system. Aware can call office if would like to have vaccine here at office. Verbalized acceptance and understanding.    Vaginal dryness -  NuSwab Vaginitis Plus (VG+)  Obesity, morbid (HCC) Assessment & Plan: Her weight has increased up 13 lbs since her last visit in July 2024. She has been on Saxenda  in the past but unfortunately her insurance does not cover GLP1s for weight loss. She is willing to try phentermine , urine pregnancy is negative. Discussed side effects to include heart palpitations and dry mouth.   Orders: -     Phentermine  HCl; Take 1 capsule (15 mg total) by mouth every morning.  Dispense: 30 capsule; Refill: 1 -     POCT urine pregnancy  BMI 35.0-35.9,adult Assessment & Plan: She is encouraged to strive for BMI less than 30 to decrease cardiac risk. Advised to aim for at least 150 minutes of exercise per week.   Orders: -     Phentermine  HCl; Take 1 capsule (15 mg total) by mouth every morning.  Dispense: 30 capsule; Refill: 1    Return for keep same next .  Patient was given opportunity to ask questions. Patient verbalized understanding of the plan and was able to repeat key elements of the plan. All questions were answered to their satisfaction.    LILLETTE Gaines Ada, FNP, have reviewed all documentation for this visit. The documentation on 11/21/23 for the exam, diagnosis, procedures, and orders are all accurate and complete.   IF YOU HAVE BEEN REFERRED TO A SPECIALIST, IT MAY TAKE 1-2 WEEKS TO SCHEDULE/PROCESS THE REFERRAL. IF YOU HAVE NOT HEARD FROM US /SPECIALIST IN TWO WEEKS, PLEASE GIVE US  A CALL AT [phone].

## 2023-11-22 LAB — HEMOGLOBIN A1C
Est. average glucose Bld gHb Est-mCnc: 131 mg/dL
Hgb A1c MFr Bld: 6.2 % — ABNORMAL HIGH (ref 4.8–5.6)

## 2023-11-23 LAB — NUSWAB VAGINITIS PLUS (VG+)
Candida albicans, NAA: NEGATIVE
Candida glabrata, NAA: NEGATIVE
Chlamydia trachomatis, NAA: NEGATIVE
Neisseria gonorrhoeae, NAA: NEGATIVE
Trich vag by NAA: NEGATIVE

## 2023-11-25 ENCOUNTER — Encounter: Payer: Self-pay | Admitting: Nurse Practitioner

## 2023-11-26 DIAGNOSIS — Z6835 Body mass index (BMI) 35.0-35.9, adult: Secondary | ICD-10-CM | POA: Insufficient documentation

## 2023-11-26 DIAGNOSIS — N898 Other specified noninflammatory disorders of vagina: Secondary | ICD-10-CM | POA: Insufficient documentation

## 2023-11-26 NOTE — Assessment & Plan Note (Signed)
 She is encouraged to strive for BMI less than 30 to decrease cardiac risk. Advised to aim for at least 150 minutes of exercise per week.

## 2023-11-26 NOTE — Assessment & Plan Note (Addendum)
 Her weight has increased up 13 lbs since her last visit in July 2024. She has been on Saxenda  in the past but unfortunately her insurance does not cover GLP1s for weight loss. She is willing to try phentermine , urine pregnancy is negative. Discussed side effects to include heart palpitations and dry mouth.

## 2023-11-26 NOTE — Assessment & Plan Note (Signed)

## 2023-11-26 NOTE — Assessment & Plan Note (Signed)

## 2023-11-26 NOTE — Assessment & Plan Note (Signed)
 HgbA1c was not checked at last visit she did not stop for labs. Continue metformin. Also encouraged to continue exercising regularly of at least 150 minutes per week. Will recheck HgbA1c.

## 2023-12-23 ENCOUNTER — Other Ambulatory Visit: Payer: Self-pay | Admitting: Nurse Practitioner

## 2023-12-23 DIAGNOSIS — Z6835 Body mass index (BMI) 35.0-35.9, adult: Secondary | ICD-10-CM

## 2023-12-23 MED ORDER — PHENTERMINE HCL 15 MG PO CAPS: 15.0000 mg | ORAL_CAPSULE | ORAL | 1 refills | Status: AC

## 2024-01-06 ENCOUNTER — Ambulatory Visit (HOSPITAL_COMMUNITY)
Admission: RE | Admit: 2024-01-06 | Discharge: 2024-01-06 | Disposition: A | Discharge status: Home / Self Care | Payer: Self-pay | Source: Ambulatory Visit | Attending: Cardiology | Admitting: Cardiology

## 2024-01-06 DIAGNOSIS — R072 Precordial pain: Secondary | ICD-10-CM | POA: Insufficient documentation

## 2024-01-07 ENCOUNTER — Encounter: Payer: Self-pay | Admitting: Cardiology

## 2024-02-18 ENCOUNTER — Ambulatory Visit: Payer: BC Managed Care – PPO | Admitting: Dermatology

## 2024-04-27 ENCOUNTER — Encounter: Payer: BC Managed Care – PPO | Admitting: Nurse Practitioner

## 2024-05-04 ENCOUNTER — Ambulatory Visit: Admitting: Dermatology

## 2024-05-05 ENCOUNTER — Encounter: Payer: BC Managed Care – PPO | Admitting: Nurse Practitioner

## 2024-05-18 ENCOUNTER — Other Ambulatory Visit: Payer: Self-pay | Admitting: Nurse Practitioner

## 2024-05-18 DIAGNOSIS — R7303 Prediabetes: Secondary | ICD-10-CM

## 2024-11-03 ENCOUNTER — Telehealth: Admitting: Physician Assistant

## 2024-11-03 DIAGNOSIS — B9789 Other viral agents as the cause of diseases classified elsewhere: Secondary | ICD-10-CM

## 2024-11-03 DIAGNOSIS — J019 Acute sinusitis, unspecified: Secondary | ICD-10-CM

## 2024-11-03 MED ORDER — AZELASTINE HCL 0.1 % NA SOLN: 2.0000 | Freq: Two times a day (BID) | NASAL | 12 refills | Status: AC

## 2024-11-03 NOTE — Progress Notes (Signed)
 We are sorry that you are not feeling well.  Here is how we plan to help!  Based on what you have shared with me it looks like you have sinusitis.  Sinusitis is inflammation and infection in the sinus cavities of the head.  Based on your presentation I believe you most likely have Acute Viral Sinusitis.This is an infection most likely caused by a virus. There is not specific treatment for viral sinusitis other than to help you with the symptoms until the infection runs its course.  You may use an oral decongestant such as Mucinex D or if you have glaucoma or high blood pressure use plain Mucinex. Saline nasal spray help and can safely be used as often as needed for congestion, I have prescribed: Azelastine nasal spray 2 sprays in each nostril twice a day  Some authorities believe that zinc sprays or the use of Echinacea may shorten the course of your symptoms.  Sinus infections are not as easily transmitted as other respiratory infection, however we still recommend that you avoid close contact with loved ones, especially the very young and elderly.  Remember to wash your hands thoroughly throughout the day as this is the number one way to prevent the spread of infection!  Home Care: Only take medications as instructed by your medical team. Do not take these medications with alcohol. A steam or ultrasonic humidifier can help congestion.  You can place a towel over your head and breathe in the steam from hot water  coming from a faucet. Avoid close contacts especially the very young and the elderly. Cover your mouth when you cough or sneeze. Always remember to wash your hands.  Get Help Right Away If: You develop worsening fever or sinus pain. You develop a severe head ache or visual changes. Your symptoms persist after you have completed your treatment plan.  Make sure you Understand these instructions. Will watch your condition. Will get help right away if you are not doing well or get  worse.  Your e-visit answers were reviewed by a board certified advanced clinical practitioner to complete your personal care plan.  Depending on the condition, your plan could have included both over the counter or prescription medications.  If there is a problem please reply  once you have received a response from your provider.  Your safety is important to us .  If you have drug allergies check your prescription carefully.    You can use MyChart to ask questions about today's visit, request a non-urgent call back, or ask for a work or school excuse for 24 hours related to this e-Visit. If it has been greater than 24 hours you will need to follow up with your provider, or enter a new e-Visit to address those concerns.  You will get an e-mail in the next two days asking about your experience.  I hope that your e-visit has been valuable and will speed your recovery. Thank you for using e-visits.  I have spent 5 minutes in review of e-visit questionnaire, review and updating patient chart, medical decision making and response to patient.   Teena Shuck, PA-C
